# Patient Record
Sex: Female | Born: 1975 | Race: White | Hispanic: No | Marital: Married | Smoking: Never smoker
Health system: Southern US, Community
[De-identification: ages and names within clinical notes are randomized; demographics above are authoritative.]

## PROBLEM LIST (undated history)

## (undated) DIAGNOSIS — N83209 Unspecified ovarian cyst, unspecified side: Secondary | ICD-10-CM

## (undated) DIAGNOSIS — H7290 Unspecified perforation of tympanic membrane, unspecified ear: Secondary | ICD-10-CM

## (undated) DIAGNOSIS — H9190 Unspecified hearing loss, unspecified ear: Secondary | ICD-10-CM

## (undated) DIAGNOSIS — K219 Gastro-esophageal reflux disease without esophagitis: Secondary | ICD-10-CM

## (undated) DIAGNOSIS — J45909 Unspecified asthma, uncomplicated: Secondary | ICD-10-CM

## (undated) HISTORY — PX: NASAL SINUS SURGERY: SHX719

---

## 2014-11-06 ENCOUNTER — Ambulatory Visit: Payer: Self-pay | Admitting: Orthopedic Surgery

## 2014-11-22 ENCOUNTER — Ambulatory Visit: Payer: Self-pay | Admitting: Orthopedic Surgery

## 2014-11-22 NOTE — H&P (Signed)
TOTAL HIP ADMISSION H&P  Patient is admitted for left total hip arthroplasty.  Subjective:  Chief Complaint: left hip pain  HPI: Jenny Sampson, 39 y.o. 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  Onset of symptoms was abrupt starting 1 years ago with rapidlly worsening course since that time.The patient noted no past surgery on the left hip(s).  Patient currently rates pain in the left hip at 10 out of 10 with activity. Patient has night pain, worsening of pain with activity and weight bearing, pain that interfers with activities of daily living and pain with passive range of motion. 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 This condition presents safety issues increasing the risk of falls.  There is no current active infection.  There are no active problems to display for this patient.  No past medical history on file.  No past surgical history on file.   (Not in a hospital admission) Allergies not on file  History  Substance Use Topics  . Smoking status: Not on file  . Smokeless tobacco: Not on file  . Alcohol Use: Not on file    No family history on file.   ROS  Objective:  Physical Exam  Vital signs in last 24 hours: @  Labs:   There is no height or weight on file to calculate BMI.   Imaging Review Plain radiographs demonstrate moderate degenerative joint disease of the left hip(s). The bone quality appears to be adequate for age and reported activity level. 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  Assessment/Plan:  AVN, left hip(s)  The patient history, physical examination, clinical judgement of the provider and imaging studies are consistent with end stage degenerative  joint disease of the left hip(s) and total hip arthroplasty is deemed medically necessary. The treatment options including medical management, injection therapy, arthroscopy and arthroplasty were discussed at length. The risks and benefits of total hip arthroplasty were presented and reviewed. The risks due to aseptic loosening, infection, stiffness, dislocation/subluxation,  thromboembolic complications and other imponderables were discussed.  The patient acknowledged the explanation, agreed to proceed with the plan and consent was signed. Patient is being admitted for inpatient treatment for surgery, pain control, PT, OT, prophylactic antibiotics, VTE prophylaxis, progressive ambulation and ADL's and discharge planning.The patient is planning to be discharged home with home health services

## 2014-11-22 NOTE — H&P (Signed)
TOTAL HIP ADMISSION H&P  Patient is admitted for left total hip arthroplasty.  Subjective:  Chief Complaint: left hip pain  HPI: Jenny Sampson, 39 y.o. 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  Onset of symptoms was abrupt starting 1 years ago with rapidlly worsening course since that time.The patient noted no past surgery on the left hip(s).  Patient currently rates pain in the left hip at 10 out of 10 with activity. Patient has night pain, worsening of pain with activity and weight bearing, pain that interfers with activities of daily living and pain with passive range of motion. 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 This condition presents safety issues increasing the risk of falls. There is no current active infection.  There are no active problems to display for this patient.  No past medical history on file.  No past surgical history on file.   (Not in a hospital admission) Allergies not on file  History  Substance Use Topics  . Smoking status: Not on file  . Smokeless tobacco: Not on file  . Alcohol Use: Not on file    No family history on file.   Review of Systems  Constitutional: Negative.   HENT: Positive for hearing loss.   Eyes: Negative.   Respiratory: Negative.   Cardiovascular: Negative.   Gastrointestinal: Positive for nausea.  Genitourinary: Negative.   Musculoskeletal: Positive for joint pain.  Skin: Negative.   Neurological: Negative.   Endo/Heme/Allergies: Negative.   Psychiatric/Behavioral: Negative.     Objective:  Physical Exam  Constitutional: She is oriented to person, place, and time. She appears well-developed and well-nourished.  HENT:  Head: Normocephalic and atraumatic.  Eyes:  Conjunctivae and EOM are normal. Pupils are equal, round, and reactive to light.  Neck: Normal range of motion. Neck supple.  Cardiovascular: Normal rate, regular rhythm, normal heart sounds and intact distal pulses.   Respiratory: Effort normal and breath sounds normal. No respiratory distress.  GI: Soft. Bowel sounds are normal. She exhibits no distension. There is no tenderness.  Genitourinary:  deferred  Musculoskeletal:       Left hip: She exhibits decreased range of motion and tenderness.  Neurological: She is alert and oriented to person, place, and time. She has normal reflexes.  Skin: Skin is warm and dry.  Psychiatric: She has a normal mood and affect. Her behavior is normal. Judgment and thought content normal.    Vital signs in last 24 hours: @VSRANGES @  Labs:   There is no height or weight on file to calculate BMI.   Imaging Review Plain radiographs demonstrate AVN of the  left hip with collapse. The bone quality appears to be adequate for age and reported activity level.  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 The treatment options including medical management, injection therapy, arthroscopy and arthroplasty were discussed at length. The risks and benefits of total hip arthroplasty were presented and reviewed. The risks due to aseptic loosening, infection, stiffness, dislocation/subluxation,  thromboembolic complications and other imponderables were discussed.  The patient acknowledged the explanation, agreed to proceed with the plan and consent was  signed. Patient is being admitted for inpatient treatment for surgery, pain control, PT, OT, prophylactic antibiotics, VTE prophylaxis, progressive ambulation and ADL's and discharge planning.The patient is planning to be  discharged home with home health services

## 2014-11-29 NOTE — Progress Notes (Signed)
Abbreviations noted per surgical consent. Need clarification. Thanks.

## 2014-11-30 ENCOUNTER — Encounter (HOSPITAL_COMMUNITY)
Admission: RE | Admit: 2014-11-30 | Discharge: 2014-11-30 | Disposition: A | Discharge status: Home / Self Care | Payer: BLUE CROSS/BLUE SHIELD | Source: Ambulatory Visit | Attending: Orthopedic Surgery | Admitting: Orthopedic Surgery

## 2014-11-30 ENCOUNTER — Encounter (HOSPITAL_COMMUNITY): Payer: Self-pay

## 2014-11-30 DIAGNOSIS — Z01818 Encounter for other preprocedural examination: Secondary | ICD-10-CM | POA: Diagnosis present

## 2014-11-30 HISTORY — DX: Gastro-esophageal reflux disease without esophagitis: K21.9

## 2014-11-30 HISTORY — DX: Unspecified hearing loss, unspecified ear: H91.90

## 2014-11-30 HISTORY — DX: Unspecified perforation of tympanic membrane, unspecified ear: H72.90

## 2014-11-30 HISTORY — DX: Unspecified ovarian cyst, unspecified side: N83.209

## 2014-11-30 HISTORY — DX: Unspecified asthma, uncomplicated: J45.909

## 2014-11-30 LAB — APTT: aPTT: 31 seconds (ref 24–37)

## 2014-11-30 LAB — SURGICAL PCR SCREEN
MRSA, PCR: NEGATIVE
Staphylococcus aureus: NEGATIVE

## 2014-11-30 LAB — ABO/RH: ABO/RH(D): B POS

## 2014-11-30 LAB — PROTIME-INR
INR: 0.93 (ref 0.00–1.49)
Prothrombin Time: 12.7 seconds (ref 11.6–15.2)

## 2014-11-30 LAB — HCG, SERUM, QUALITATIVE: Preg, Serum: NEGATIVE

## 2014-11-30 NOTE — Patient Instructions (Signed)
Jenny Sampson  11/30/2014   Your procedure is scheduled on: Thursday December 07, 2014  Report to [facility] Main  Entrance take [place]  elevators to 3rd floor to  Short Stay Center at 8:15 AM.  Call this number if you have problems the morning of surgery [phone]   Remember: ONLY 1 PERSON MAY GO WITH YOU TO SHORT STAY TO GET  READY MORNING OF YOUR SURGERY.  Do not eat food or drink liquids :After Midnight.     Take these medicines the morning of surgery with A SIP OF WATER: NONE                               You may not have any metal on your body including hair pins and              piercings  Do not wear jewelry, make-up, lotions, powders or perfumes, deodorant             Do not wear nail polish.  Do not shave  48 hours prior to surgery.               Do not bring valuables to the hospital. [facility] IS NOT             RESPONSIBLE   FOR VALUABLES.  Contacts, dentures or bridgework may not be worn into surgery.  Leave suitcase in the car. After surgery it may be brought to your room.                  Please read over the following fact sheets you were given:MRSA INFORMATION SHEET; INCENTIVE SPIROMETER; BLOOD TRANSFUSION INFORMATION SHEET _____________________________________________________________________             [facility] - Preparing for Surgery Before surgery, you can play an important role.  Because skin is not sterile, your skin needs to be as free of germs as possible.  You can reduce the number of germs on your skin by washing with CHG (chlorahexidine gluconate) soap before surgery.  CHG is an antiseptic cleaner which kills germs and bonds with the skin to continue killing germs even after washing. Please DO NOT use if you have an allergy to CHG or antibacterial soaps.  If your skin becomes reddened/irritated stop using the CHG and inform your nurse when you arrive at Short Stay. Do not shave (including legs and underarms) for at least 48 hours  prior to the first CHG shower.  You may shave your face/neck. Please follow these instructions carefully:  1.  Shower with CHG Soap the night before surgery and the  morning of Surgery.  2.  If you choose to wash your hair, wash your hair first as usual with your  normal  shampoo.  3.  After you shampoo, rinse your hair and body thoroughly to remove the  shampoo.                           4.  Use CHG as you would any other liquid soap.  You can apply chg directly  to the skin and wash                       Gently with a scrungie or clean washcloth.  5.  Apply the CHG  Soap to your body ONLY FROM THE NECK DOWN.   Do not use on face/ open                           Wound or open sores. Avoid contact with eyes, ears mouth and genitals (private parts).                       Wash face,  Genitals (private parts) with your normal soap.             6.  Wash thoroughly, paying special attention to the area where your surgery  will be performed.  7.  Thoroughly rinse your body with warm water from the neck down.  8.  DO NOT shower/wash with your normal soap after using and rinsing off  the CHG Soap.                9.  Pat yourself dry with a clean towel.            10.  Wear clean pajamas.            11.  Place clean sheets on your bed the night of your first shower and do not  sleep with pets. Day of Surgery : Do not apply any lotions/deodorants the morning of surgery.  Please wear clean clothes to the hospital/surgery center.  FAILURE TO FOLLOW THESE INSTRUCTIONS MAY RESULT IN THE CANCELLATION OF YOUR SURGERY PATIENT SIGNATURE_________________________________  NURSE SIGNATURE__________________________________  ________________________________________________________________________   Jenny Sampson  An incentive spirometer is a tool that can help keep your lungs clear and active. This tool measures how well you are filling your lungs with each breath. Taking long deep breaths may help reverse  or decrease the chance of developing breathing (pulmonary) problems (especially infection) following:  A long period of time when you are unable to move or be active. BEFORE THE PROCEDURE   If the spirometer includes an indicator to show your best effort, your nurse or respiratory therapist will set it to a desired goal.  If possible, sit up straight or lean slightly forward. Try not to slouch.  Hold the incentive spirometer in an upright position. INSTRUCTIONS FOR USE   Sit on the edge of your bed if possible, or sit up as far as you can in bed or on a chair.  Hold the incentive spirometer in an upright position.  Breathe out normally.  Place the mouthpiece in your mouth and seal your lips tightly around it.  Breathe in slowly and as deeply as possible, raising the piston or the ball toward the top of the column.  Hold your breath for 3-5 seconds or for as long as possible. Allow the piston or ball to fall to the bottom of the column.  Remove the mouthpiece from your mouth and breathe out normally.  Rest for a few seconds and repeat Steps 1 through 7 at least 10 times every 1-2 hours when you are awake. Take your time and take a few normal breaths between deep breaths.  The spirometer may include an indicator to show your best effort. Use the indicator as a goal to work toward during each repetition.  After each set of 10 deep breaths, practice coughing to be sure your lungs are clear. If you have an incision (the cut made at the time of surgery), support your incision when coughing by placing a pillow or rolled up towels  firmly against it. Once you are able to get out of bed, walk around indoors and cough well. You may stop using the incentive spirometer when instructed by your caregiver.  RISKS AND COMPLICATIONS  Take your time so you do not get dizzy or light-headed.  If you are in pain, you may need to take or ask for pain medication before doing incentive spirometry. It is  harder to take a deep breath if you are having pain. AFTER USE  Rest and breathe slowly and easily.  It can be helpful to keep track of a log of your progress. Your caregiver can provide you with a simple table to help with this. If you are using the spirometer at home, follow these instructions: [place] IF:   You are having difficultly using the spirometer.  You have trouble using the spirometer as often as instructed.  Your pain medication is not giving enough relief while using the spirometer.  You develop fever of 100.5 F (38.1 C) or higher. SEEK IMMEDIATE MEDICAL CARE IF:   You cough up bloody sputum that had not been present before.  You develop fever of 102 F (38.9 C) or greater.  You develop worsening pain at or near the incision site. MAKE SURE YOU:   Understand these instructions.  Will watch your condition.  Will get help right away if you are not doing well or get worse. Document Released: 08/25/2006 Document Revised: 07/07/2011 Document Reviewed: 10/26/2006 Exit[employer] Patient Information 2014 [employer], [place].   ________________________________________________________________________  WHAT IS A BLOOD TRANSFUSION? Blood Transfusion Information  A transfusion is the replacement of blood or some of its parts. Blood is made up of multiple cells which provide different functions.  Red blood cells carry oxygen and are used for blood loss replacement.  White blood cells fight against infection.  Platelets control bleeding.  Plasma helps clot blood.  Other blood products are available for specialized needs, such as hemophilia or other clotting disorders. BEFORE THE TRANSFUSION  Who gives blood for transfusions?   Healthy volunteers who are fully evaluated to make sure their blood is safe. This is blood bank blood. Transfusion therapy is the safest it has ever been in the practice of medicine. Before blood is taken from a donor, a complete history  is taken to make sure that person has no history of diseases nor engages in risky social behavior (examples are intravenous drug use or sexual activity with multiple partners). The donor's travel history is screened to minimize risk of transmitting infections, such as malaria. The donated blood is tested for signs of infectious diseases, such as HIV and hepatitis. The blood is then tested to be sure it is compatible with you in order to minimize the chance of a transfusion reaction. If you or a relative donates blood, this is often done in anticipation of surgery and is not appropriate for emergency situations. It takes many days to process the donated blood. RISKS AND COMPLICATIONS Although transfusion therapy is very safe and saves many lives, the main dangers of transfusion include:   Getting an infectious disease.  Developing a transfusion reaction. This is an allergic reaction to something in the blood you were given. Every precaution is taken to prevent this. The decision to have a blood transfusion has been considered carefully by your caregiver before blood is given. Blood is not given unless the benefits outweigh the risks. AFTER THE TRANSFUSION  Right after receiving a blood transfusion, you will usually feel much better  and more energetic. This is especially true if your red blood cells have gotten low (anemic). The transfusion raises the level of the red blood cells which carry oxygen, and this usually causes an energy increase.  The nurse administering the transfusion will monitor you carefully for complications. HOME CARE INSTRUCTIONS  No special instructions are needed after a transfusion. You may find your energy is better. Speak with your caregiver about any limitations on activity for underlying diseases you may have. SEEK MEDICAL CARE IF:   Your condition is not improving after your transfusion.  You develop redness or irritation at the intravenous (IV) site. SEEK IMMEDIATE  MEDICAL CARE IF:  Any of the following symptoms occur over the next 12 hours:  Shaking chills.  You have a temperature by mouth above 102 F (38.9 C), not controlled by medicine.  Chest, back, or muscle pain.  People around you feel you are not acting correctly or are confused.  Shortness of breath or difficulty breathing.  Dizziness and fainting.  You get a rash or develop hives.  You have a decrease in urine output.  Your urine turns a dark color or changes to pink, red, or brown. Any of the following symptoms occur over the next 10 days:  You have a temperature by mouth above 102 F (38.9 C), not controlled by medicine.  Shortness of breath.  Weakness after normal activity.  The white part of the eye turns yellow (jaundice).  You have a decrease in the amount of urine or are urinating less often.  Your urine turns a dark color or changes to pink, red, or brown. Document Released: 04/11/2000 Document Revised: 07/07/2011 Document Reviewed: 11/29/2007 [facility] Patient Information 2014 [place], [place].  _______________________________________________________________________

## 2014-11-30 NOTE — Progress Notes (Addendum)
H&P per chart per [facility] 11/23/2014 (Dr Leonor Liv) CBCD and CMP results from 11/23/2014 on chart Clearance note per chart per Dr Leonor Liv 11/23/2014  EKG per chart 11/23/2014

## 2014-12-05 ENCOUNTER — Ambulatory Visit: Payer: Self-pay | Admitting: Orthopedic Surgery

## 2014-12-06 LAB — TYPE AND SCREEN
ABO/RH(D): B POS
Antibody Screen: POSITIVE
DAT, IgG: NEGATIVE

## 2014-12-07 ENCOUNTER — Encounter (HOSPITAL_COMMUNITY)
Admission: RE | Disposition: A | Discharge status: Home Health | Payer: Self-pay | Source: Ambulatory Visit | Attending: Orthopedic Surgery

## 2014-12-07 ENCOUNTER — Inpatient Hospital Stay (HOSPITAL_COMMUNITY): Payer: BLUE CROSS/BLUE SHIELD

## 2014-12-07 ENCOUNTER — Inpatient Hospital Stay (HOSPITAL_COMMUNITY): Payer: BLUE CROSS/BLUE SHIELD | Admitting: Anesthesiology

## 2014-12-07 ENCOUNTER — Encounter (HOSPITAL_COMMUNITY): Payer: Self-pay | Admitting: *Deleted

## 2014-12-07 ENCOUNTER — Inpatient Hospital Stay (HOSPITAL_COMMUNITY)
Admission: RE | Admit: 2014-12-07 | Discharge: 2014-12-08 | DRG: 470 | Disposition: A | Discharge status: Home Health | Payer: BLUE CROSS/BLUE SHIELD | Source: Ambulatory Visit | Attending: Orthopedic Surgery | Admitting: Orthopedic Surgery

## 2014-12-07 DIAGNOSIS — J45909 Unspecified asthma, uncomplicated: Secondary | ICD-10-CM | POA: Diagnosis present

## 2014-12-07 DIAGNOSIS — H9190 Unspecified hearing loss, unspecified ear: Secondary | ICD-10-CM | POA: Diagnosis present

## 2014-12-07 DIAGNOSIS — Z09 Encounter for follow-up examination after completed treatment for conditions other than malignant neoplasm: Secondary | ICD-10-CM

## 2014-12-07 DIAGNOSIS — M25552 Pain in left hip: Secondary | ICD-10-CM | POA: Diagnosis present

## 2014-12-07 DIAGNOSIS — M87059 Idiopathic aseptic necrosis of unspecified femur: Secondary | ICD-10-CM | POA: Diagnosis present

## 2014-12-07 DIAGNOSIS — M879 Osteonecrosis, unspecified: Principal | ICD-10-CM | POA: Diagnosis present

## 2014-12-07 DIAGNOSIS — M8700 Idiopathic aseptic necrosis of unspecified bone: Secondary | ICD-10-CM

## 2014-12-07 DIAGNOSIS — M25752 Osteophyte, left hip: Secondary | ICD-10-CM | POA: Diagnosis present

## 2014-12-07 DIAGNOSIS — Z01812 Encounter for preprocedural laboratory examination: Secondary | ICD-10-CM

## 2014-12-07 DIAGNOSIS — K219 Gastro-esophageal reflux disease without esophagitis: Secondary | ICD-10-CM | POA: Diagnosis present

## 2014-12-07 DIAGNOSIS — M87052 Idiopathic aseptic necrosis of left femur: Secondary | ICD-10-CM | POA: Diagnosis present

## 2014-12-07 HISTORY — PX: TOTAL HIP ARTHROPLASTY: SHX124

## 2014-12-07 SURGERY — ARTHROPLASTY, HIP, TOTAL, ANTERIOR APPROACH
Anesthesia: General | Site: Hip | Laterality: Left

## 2014-12-07 MED ORDER — SODIUM CHLORIDE 0.9 % IJ SOLN
INTRAMUSCULAR | Status: DC | PRN
  Administered 2014-12-07: 30 mL

## 2014-12-07 MED ORDER — MIDAZOLAM HCL 2 MG/2ML IJ SOLN
INTRAMUSCULAR | Status: AC
  Filled 2014-12-07: qty 4

## 2014-12-07 MED ORDER — GLYCOPYRROLATE 0.2 MG/ML IJ SOLN
INTRAMUSCULAR | Status: AC
  Filled 2014-12-07: qty 3

## 2014-12-07 MED ORDER — FENTANYL CITRATE (PF) 100 MCG/2ML IJ SOLN
INTRAMUSCULAR | Status: AC
  Filled 2014-12-07: qty 4

## 2014-12-07 MED ORDER — ASPIRIN EC 325 MG PO TBEC
325.0000 mg | DELAYED_RELEASE_TABLET | Freq: Two times a day (BID) | ORAL | Status: DC
  Administered 2014-12-08: 325 mg via ORAL
  Filled 2014-12-07 (×5): qty 1

## 2014-12-07 MED ORDER — DEXAMETHASONE SODIUM PHOSPHATE 10 MG/ML IJ SOLN
INTRAMUSCULAR | Status: AC
  Filled 2014-12-07: qty 1

## 2014-12-07 MED ORDER — MENTHOL 3 MG MT LOZG: 1.0000 | LOZENGE | OROMUCOSAL | Status: DC | PRN

## 2014-12-07 MED ORDER — CEFAZOLIN SODIUM-DEXTROSE 2-3 GM-% IV SOLR
2.0000 g | INTRAVENOUS | Status: AC
  Administered 2014-12-07: 2 g via INTRAVENOUS

## 2014-12-07 MED ORDER — ONDANSETRON HCL 4 MG PO TABS: 4.0000 mg | ORAL_TABLET | Freq: Four times a day (QID) | ORAL | Status: DC | PRN

## 2014-12-07 MED ORDER — CHLORHEXIDINE GLUCONATE 4 % EX LIQD: 60.0000 mL | Freq: Once | CUTANEOUS | Status: DC

## 2014-12-07 MED ORDER — PROMETHAZINE HCL 25 MG/ML IJ SOLN: 6.2500 mg | INTRAMUSCULAR | Status: DC | PRN

## 2014-12-07 MED ORDER — PROPOFOL 10 MG/ML IV BOLUS
INTRAVENOUS | Status: AC
  Filled 2014-12-07: qty 20

## 2014-12-07 MED ORDER — GLYCOPYRROLATE 0.2 MG/ML IJ SOLN
INTRAMUSCULAR | Status: DC | PRN
  Administered 2014-12-07: .5 mg via INTRAVENOUS

## 2014-12-07 MED ORDER — FENTANYL CITRATE (PF) 250 MCG/5ML IJ SOLN
INTRAMUSCULAR | Status: DC | PRN
  Administered 2014-12-07 (×2): 50 ug via INTRAVENOUS
  Administered 2014-12-07: 100 ug via INTRAVENOUS
  Administered 2014-12-07 (×2): 50 ug via INTRAVENOUS

## 2014-12-07 MED ORDER — OXYCODONE HCL 5 MG/5ML PO SOLN: 5.0000 mg | Freq: Once | ORAL | Status: DC | PRN

## 2014-12-07 MED ORDER — SODIUM CHLORIDE 0.9 % IJ SOLN
INTRAMUSCULAR | Status: AC
  Filled 2014-12-07: qty 50

## 2014-12-07 MED ORDER — HYDROMORPHONE HCL 1 MG/ML IJ SOLN
INTRAMUSCULAR | Status: AC
  Filled 2014-12-07: qty 1

## 2014-12-07 MED ORDER — ACETAMINOPHEN 650 MG RE SUPP: 650.0000 mg | Freq: Four times a day (QID) | RECTAL | Status: DC | PRN

## 2014-12-07 MED ORDER — LACTATED RINGERS IV SOLN
INTRAVENOUS | Status: DC
  Administered 2014-12-07: 12:00:00 via INTRAVENOUS
  Administered 2014-12-07: 1000 mL via INTRAVENOUS

## 2014-12-07 MED ORDER — ISOPROPYL ALCOHOL 70 % SOLN
Status: DC | PRN
Start: 2014-12-07 — End: 2014-12-07
  Administered 2014-12-07: 1 via TOPICAL

## 2014-12-07 MED ORDER — SODIUM CHLORIDE 0.9 % IR SOLN
Status: DC | PRN
  Administered 2014-12-07: 1000 mL

## 2014-12-07 MED ORDER — BUPIVACAINE-EPINEPHRINE 0.25% -1:200000 IJ SOLN
INTRAMUSCULAR | Status: DC | PRN
  Administered 2014-12-07: 30 mL

## 2014-12-07 MED ORDER — PHENOL 1.4 % MT LIQD
1.0000 | OROMUCOSAL | Status: DC | PRN
  Filled 2014-12-07: qty 177

## 2014-12-07 MED ORDER — DEXAMETHASONE SODIUM PHOSPHATE 10 MG/ML IJ SOLN
10.0000 mg | Freq: Once | INTRAMUSCULAR | Status: AC
  Administered 2014-12-08: 10 mg via INTRAVENOUS
  Filled 2014-12-07: qty 1

## 2014-12-07 MED ORDER — DOCUSATE SODIUM 100 MG PO CAPS
100.0000 mg | ORAL_CAPSULE | Freq: Two times a day (BID) | ORAL | Status: DC
  Administered 2014-12-08: 100 mg via ORAL

## 2014-12-07 MED ORDER — MIDAZOLAM HCL 5 MG/5ML IJ SOLN
INTRAMUSCULAR | Status: DC | PRN
  Administered 2014-12-07: 2 mg via INTRAVENOUS

## 2014-12-07 MED ORDER — ONDANSETRON HCL 4 MG/2ML IJ SOLN
INTRAMUSCULAR | Status: AC
  Filled 2014-12-07: qty 2

## 2014-12-07 MED ORDER — SODIUM CHLORIDE 0.9 % IV SOLN
INTRAVENOUS | Status: DC
  Administered 2014-12-07 (×2): via INTRAVENOUS

## 2014-12-07 MED ORDER — KETOROLAC TROMETHAMINE 15 MG/ML IJ SOLN
15.0000 mg | Freq: Four times a day (QID) | INTRAMUSCULAR | Status: AC
  Administered 2014-12-07 – 2014-12-08 (×3): 15 mg via INTRAVENOUS
  Filled 2014-12-07 (×3): qty 1

## 2014-12-07 MED ORDER — HYDROMORPHONE HCL 1 MG/ML IJ SOLN: 0.5000 mg | INTRAMUSCULAR | Status: DC | PRN

## 2014-12-07 MED ORDER — ACETAMINOPHEN 325 MG PO TABS
650.0000 mg | ORAL_TABLET | Freq: Four times a day (QID) | ORAL | Status: DC | PRN
  Administered 2014-12-07 – 2014-12-08 (×2): 650 mg via ORAL
  Filled 2014-12-07 (×2): qty 2

## 2014-12-07 MED ORDER — SUCCINYLCHOLINE CHLORIDE 200 MG/10ML IV SOSY
PREFILLED_SYRINGE | INTRAVENOUS | Status: DC | PRN
  Administered 2014-12-07: 80 mg via INTRAVENOUS

## 2014-12-07 MED ORDER — TRANEXAMIC ACID 1000 MG/10ML IV SOLN
1000.0000 mg | INTRAVENOUS | Status: AC
  Administered 2014-12-07: 1000 mg via INTRAVENOUS
  Filled 2014-12-07: qty 10

## 2014-12-07 MED ORDER — OXYCODONE HCL 5 MG PO TABS: 5.0000 mg | ORAL_TABLET | Freq: Once | ORAL | Status: DC | PRN

## 2014-12-07 MED ORDER — ONDANSETRON HCL 4 MG/2ML IJ SOLN
INTRAMUSCULAR | Status: DC | PRN
  Administered 2014-12-07: 4 mg via INTRAVENOUS

## 2014-12-07 MED ORDER — SODIUM CHLORIDE 0.9 % IR SOLN
Status: DC | PRN
  Administered 2014-12-07: 3000 mL

## 2014-12-07 MED ORDER — METOCLOPRAMIDE HCL 10 MG PO TABS: 5.0000 mg | ORAL_TABLET | Freq: Three times a day (TID) | ORAL | Status: DC | PRN

## 2014-12-07 MED ORDER — ROCURONIUM BROMIDE 100 MG/10ML IV SOLN
INTRAVENOUS | Status: DC | PRN
  Administered 2014-12-07: 10 mg via INTRAVENOUS

## 2014-12-07 MED ORDER — KETOROLAC TROMETHAMINE 30 MG/ML IJ SOLN
INTRAMUSCULAR | Status: DC | PRN
  Administered 2014-12-07: 30 mg via INTRAVENOUS

## 2014-12-07 MED ORDER — PROMETHAZINE HCL 25 MG/ML IJ SOLN: 6.2500 mg | Freq: Three times a day (TID) | INTRAMUSCULAR | Status: DC | PRN

## 2014-12-07 MED ORDER — ISOPROPYL ALCOHOL 70 % SOLN
Status: AC
  Filled 2014-12-07: qty 480

## 2014-12-07 MED ORDER — DEXAMETHASONE SODIUM PHOSPHATE 10 MG/ML IJ SOLN
INTRAMUSCULAR | Status: DC | PRN
  Administered 2014-12-07: 10 mg via INTRAVENOUS

## 2014-12-07 MED ORDER — CEFAZOLIN SODIUM-DEXTROSE 2-3 GM-% IV SOLR
INTRAVENOUS | Status: AC
  Filled 2014-12-07: qty 50

## 2014-12-07 MED ORDER — SODIUM CHLORIDE 0.9 % IV SOLN: INTRAVENOUS | Status: DC

## 2014-12-07 MED ORDER — HYDROCODONE-ACETAMINOPHEN 5-325 MG PO TABS: 1.0000 | ORAL_TABLET | ORAL | Status: DC | PRN

## 2014-12-07 MED ORDER — HYDROGEN PEROXIDE 3 % EX SOLN
CUTANEOUS | Status: DC | PRN
  Administered 2014-12-07: 1

## 2014-12-07 MED ORDER — ONDANSETRON HCL 4 MG/2ML IJ SOLN
4.0000 mg | Freq: Four times a day (QID) | INTRAMUSCULAR | Status: DC | PRN
  Administered 2014-12-07: 4 mg via INTRAVENOUS
  Filled 2014-12-07: qty 2

## 2014-12-07 MED ORDER — PROPOFOL 10 MG/ML IV BOLUS
INTRAVENOUS | Status: DC | PRN
  Administered 2014-12-07: 30 mg via INTRAVENOUS
  Administered 2014-12-07: 120 mg via INTRAVENOUS

## 2014-12-07 MED ORDER — KETOROLAC TROMETHAMINE 30 MG/ML IJ SOLN
INTRAMUSCULAR | Status: AC
  Filled 2014-12-07: qty 1

## 2014-12-07 MED ORDER — SENNA 8.6 MG PO TABS: 2.0000 | ORAL_TABLET | Freq: Every day | ORAL | Status: DC

## 2014-12-07 MED ORDER — BUPIVACAINE-EPINEPHRINE (PF) 0.25% -1:200000 IJ SOLN
INTRAMUSCULAR | Status: AC
  Filled 2014-12-07: qty 30

## 2014-12-07 MED ORDER — CEFAZOLIN SODIUM 1-5 GM-% IV SOLN
1.0000 g | Freq: Four times a day (QID) | INTRAVENOUS | Status: AC
  Administered 2014-12-07 (×2): 1 g via INTRAVENOUS
  Filled 2014-12-07 (×2): qty 50

## 2014-12-07 MED ORDER — NEOSTIGMINE METHYLSULFATE 10 MG/10ML IV SOLN
INTRAVENOUS | Status: DC | PRN
  Administered 2014-12-07: 3 mg via INTRAVENOUS

## 2014-12-07 MED ORDER — HYDROGEN PEROXIDE 3 % EX SOLN
CUTANEOUS | Status: AC
  Filled 2014-12-07: qty 473

## 2014-12-07 MED ORDER — FENTANYL CITRATE (PF) 250 MCG/5ML IJ SOLN
INTRAMUSCULAR | Status: AC
  Filled 2014-12-07: qty 25

## 2014-12-07 MED ORDER — METOCLOPRAMIDE HCL 5 MG/ML IJ SOLN
5.0000 mg | Freq: Three times a day (TID) | INTRAMUSCULAR | Status: DC | PRN
  Administered 2014-12-07 – 2014-12-08 (×2): 10 mg via INTRAVENOUS
  Filled 2014-12-07 (×3): qty 2

## 2014-12-07 MED ORDER — HYDROMORPHONE HCL 1 MG/ML IJ SOLN
0.2500 mg | INTRAMUSCULAR | Status: DC | PRN
  Administered 2014-12-07 (×2): 0.25 mg via INTRAVENOUS

## 2014-12-07 SURGICAL SUPPLY — 44 items
BAG DECANTER FOR FLEXI CONT (MISCELLANEOUS) IMPLANT
BAG ZIPLOCK 12X15 (MISCELLANEOUS) IMPLANT
CAPT HIP TOTAL 2 ×2 IMPLANT
CHLORAPREP W/TINT 26ML (MISCELLANEOUS) ×2 IMPLANT
COVER PERINEAL POST (MISCELLANEOUS) ×2 IMPLANT
DECANTER SPIKE VIAL GLASS SM (MISCELLANEOUS) ×2 IMPLANT
DRAPE C-ARM 42X120 X-RAY (DRAPES) ×2 IMPLANT
DRAPE STERI IOBAN 125X83 (DRAPES) ×2 IMPLANT
DRAPE U-SHAPE 47X51 STRL (DRAPES) ×4 IMPLANT
DRSG AQUACEL AG ADV 3.5X10 (GAUZE/BANDAGES/DRESSINGS) ×2 IMPLANT
ELECT BLADE TIP CTD 4 INCH (ELECTRODE) ×2 IMPLANT
ELECT PENCIL ROCKER SW 15FT (MISCELLANEOUS) ×2 IMPLANT
ELECT REM PT RETURN 15FT ADLT (MISCELLANEOUS) ×2 IMPLANT
FACESHIELD WRAPAROUND (MASK) ×4 IMPLANT
GAUZE SPONGE 4X4 12PLY STRL (GAUZE/BANDAGES/DRESSINGS) ×2 IMPLANT
GLOVE BIO SURGEON STRL SZ8.5 (GLOVE) ×4 IMPLANT
GLOVE BIOGEL PI IND STRL 8.5 (GLOVE) ×1 IMPLANT
GLOVE BIOGEL PI INDICATOR 8.5 (GLOVE) ×1
GOWN SPEC L3 XXLG W/TWL (GOWN DISPOSABLE) ×2 IMPLANT
HANDPIECE INTERPULSE COAX TIP (DISPOSABLE) ×1
HOLDER FOLEY CATH W/STRAP (MISCELLANEOUS) ×2 IMPLANT
HOOD PEEL AWAY FACE SHEILD DIS (HOOD) ×4 IMPLANT
KIT BASIN OR (CUSTOM PROCEDURE TRAY) ×2 IMPLANT
LIQUID BAND (GAUZE/BANDAGES/DRESSINGS) ×2 IMPLANT
NEEDLE SPNL 18GX3.5 QUINCKE PK (NEEDLE) ×2 IMPLANT
PACK TOTAL JOINT (CUSTOM PROCEDURE TRAY) ×2 IMPLANT
PEN SKIN MARKING BROAD (MISCELLANEOUS) ×2 IMPLANT
SAW OSC TIP CART 19.5X105X1.3 (SAW) ×2 IMPLANT
SEALER BIPOLAR AQUA 6.0 (INSTRUMENTS) ×2 IMPLANT
SET HNDPC FAN SPRY TIP SCT (DISPOSABLE) ×1 IMPLANT
SUT ETHIBOND NAB CT1 #1 30IN (SUTURE) ×4 IMPLANT
SUT MNCRL AB 3-0 PS2 18 (SUTURE) ×2 IMPLANT
SUT MON AB 2-0 CT1 36 (SUTURE) ×4 IMPLANT
SUT VIC AB 1 CT1 36 (SUTURE) ×2 IMPLANT
SUT VIC AB 2-0 CT1 27 (SUTURE) ×1
SUT VIC AB 2-0 CT1 TAPERPNT 27 (SUTURE) ×1 IMPLANT
SUT VLOC 180 0 24IN GS25 (SUTURE) ×2 IMPLANT
SYR 50ML LL SCALE MARK (SYRINGE) ×2 IMPLANT
TOWEL OR 17X26 10 PK STRL BLUE (TOWEL DISPOSABLE) ×2 IMPLANT
TOWEL OR NON WOVEN STRL DISP B (DISPOSABLE) ×2 IMPLANT
TRAY FOLEY W/METER SILVER 14FR (SET/KITS/TRAYS/PACK) ×2 IMPLANT
TRAY FOLEY W/METER SILVER 16FR (SET/KITS/TRAYS/PACK) ×2 IMPLANT
WATER STERILE IRR 1500ML POUR (IV SOLUTION) ×2 IMPLANT
YANKAUER SUCT BULB TIP 10FT TU (MISCELLANEOUS) ×2 IMPLANT

## 2014-12-07 NOTE — Anesthesia Preprocedure Evaluation (Addendum)
Anesthesia Evaluation  Patient identified by MRN, date of birth, ID band Patient awake    Reviewed: Allergy & Precautions, H&P , NPO status , Patient's Chart, lab work & pertinent test results  History of Anesthesia Complications Negative for: history of anesthetic complications  Airway Mallampati: II  TM Distance: >3 FB Neck ROM: full    Dental no notable dental hx.    Pulmonary neg pulmonary ROS,  breath sounds clear to auscultation  Pulmonary exam normal       Cardiovascular negative cardio ROS Normal cardiovascular examRhythm:regular Rate:Normal     Neuro/Psych negative neurological ROS     GI/Hepatic negative GI ROS, Neg liver ROS,   Endo/Other  negative endocrine ROS  Renal/GU negative Renal ROS     Musculoskeletal   Abdominal   Peds  Hematology negative hematology ROS (+)   Anesthesia Other Findings   Reproductive/Obstetrics negative OB ROS                             Anesthesia Physical Anesthesia Plan  ASA: II  Anesthesia Plan: General   Post-op Pain Management:    Induction: Intravenous  Airway Management Planned: Oral ETT  Additional Equipment:   Intra-op Plan:   Post-operative Plan:   Informed Consent: I have reviewed the patients History and Physical, chart, labs and discussed the procedure including the risks, benefits and alternatives for the proposed anesthesia with the patient or authorized representative who has indicated his/her understanding and acceptance.     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

## 2014-12-07 NOTE — Op Note (Signed)
OPERATIVE REPORT  SURGEON: Samson Frederic, MD   ASSISTANT: Karlton Lemon, PA-C.  PREOPERATIVE DIAGNOSIS: Avascular necrosis, femoral head, Left hip.   POSTOPERATIVE DIAGNOSIS:  Avascular necrosis, femoral head, Left hip.   PROCEDURE: Left total hip arthroplasty, anterior approach.   IMPLANTS: DePuy Tri Lock stem, size 2, std offset. DePuy Pinnacle Cup, size 48 mm. DePuy Altrx liner, size 48 by 28 mm. DePuy Biolox ceramic head ball, size 28 + 1.5 mm.  ANESTHESIA:  General   ESTIMATED BLOOD LOSS: 250 mL.  ANTIBIOTICS: 2g ancef.  DRAINS: None.  COMPLICATIONS: None.   CONDITION: PACU - hemodynamically stable.Marland Kitchen   BRIEF CLINICAL NOTE: Jenny Sampson is a 39 y.o. female with a long-standing history of Left hip AVN. After failing conservative management, the patient was indicated for total hip arthroplasty. The risks, benefits, and alternatives to the procedure were explained, and the patient elected to proceed.  PROCEDURE IN DETAIL: Surgical site was marked by myself. Once inside the operative room, general anesthesia was induced and a foley catheter was inserted. The patient was then positioned on the Hana table. All bony prominences were well padded. The hip was prepped and draped in the normal sterile surgical fashion. A time-out was called verifying side and site of surgery. The patient received IV antibiotics within 60 minutes of beginning the procedure.  The direct anterior approach to the hip was performed through the Hueter interval. Lateral femoral circumflex vessels were treated with the Auqumantys. The anterior capsule was exposed and an inverted T capsulotomy was made.The femoral neck cut was made to the level of the templated cut. A corkscrew was placed into the head and the head was removed. The femoral head was found to have delaminated cartilage. The head was passed to the back table and was measured.  Acetabular exposure was achieved, and the pulvinar and  labrum were excised. Sequental reaming of the acetabulum was then performed up to a size 47 mm reamer. A 48 mm cup was then opened and impacted into place at approximately 40 degrees of abduction and 20 degrees of anteversion. The final polyethylene liner was impacted into place and acetabular osteophytes were removed.   I then gained femoral exposure taking care to protect the abductors and greater trochanter. This was performed using standard external rotation, extension, and adduction. The capsule was peeled off the inner aspect of the greater trochanter, taking care to preserve the short external rotators. A cookie cutter was used to enter the femoral canal, and then the femoral canal finder was placed. Sequential broaching was performed up to a size 2. Calcar planer was used on the femoral neck remnant. I paced a std offset neck and a trial head ball. The hip was reduced. Leg lengths and offset were checked fluoroscopically. The hip was dislocated and trial components were removed. The final implants were placed, and the hip was reduced.  Fluoroscopy was used to confirm component position and leg lengths. At 90 degrees of external rotation and full extension, the hip was stable to an anterior directed force.  The wound was copiously irrigated with a dilute betadine solution followed by normal saline. Marcaine solution was injected into the periarticular soft tissue. The wound was closed in layers using #1 Vicryl and V-Loc for the fascia, 2-0 Vicryl for the subcutaneous fat, 2-0 Monocryl for the deep dermal layer, 3-0 running Monocryl subcuticular stitch, and Dermabond for the skin. Once the glue was fully dried, an Aquacell Ag dressing was applied. The patient was transported to  the recovery room in stable condition. Sponge, needle, and instrument counts were correct at the end of the case x2. The patient tolerated the procedure well and there were no known complications.  Please note  that a surgical assistant was a medical necessity for this procedure to perform it in a safe and expeditious manner. Assistant was necessary to provide appropriate retraction of vital neurovascular structures, to prevent femoral fracture, and to allow for anatomic placement of the prosthesis.

## 2014-12-07 NOTE — Anesthesia Procedure Notes (Signed)
Procedure Name: Intubation Date/Time: 12/07/2014 10:59 AM Performed by: Early Osmond E Pre-anesthesia Checklist: Patient identified, Emergency Drugs available, Suction available and Patient being monitored Patient Re-evaluated:Patient Re-evaluated prior to inductionOxygen Delivery Method: Circle System Utilized Preoxygenation: Pre-oxygenation with 100% oxygen Intubation Type: IV induction Ventilation: Mask ventilation without difficulty Laryngoscope Size: Miller and 2 Grade View: Grade I Tube type: Oral Tube size: 7.0 mm Number of attempts: 1 Airway Equipment and Method: Stylet Placement Confirmation: ETT inserted through vocal cords under direct vision,  positive ETCO2 and breath sounds checked- equal and bilateral Secured at: 18 cm Tube secured with: Tape Dental Injury: Teeth and Oropharynx as per pre-operative assessment

## 2014-12-07 NOTE — Transfer of Care (Signed)
Immediate Anesthesia Transfer of Care Note  Patient: Jenny Sampson  Procedure(s) Performed: Procedure(s): LEFT TOTAL HIP ARTHROPLASTY ANTERIOR APPROACH (Left)  Patient Location: PACU  Anesthesia Type:General  Level of Consciousness: awake, alert , oriented and patient cooperative  Airway & Oxygen Therapy: Patient Spontanous Breathing and Patient connected to face mask oxygen  Post-op Assessment: Report given to RN and Post -op Vital signs reviewed and stable  Post vital signs: Reviewed and stable  Last Vitals:  Filed Vitals:   12/07/14 0803  BP: 114/67  Pulse: 56  Temp: 36.6 C  Resp: 18    Complications: No apparent anesthesia complications

## 2014-12-07 NOTE — Anesthesia Postprocedure Evaluation (Signed)
  Anesthesia Post-op Note  Patient: Jenny Sampson  Procedure(s) Performed: Procedure(s) (LRB): LEFT TOTAL HIP ARTHROPLASTY ANTERIOR APPROACH (Left)  Patient Location: PACU  Anesthesia Type: General  Level of Consciousness: awake and alert   Airway and Oxygen Therapy: Patient Spontanous Breathing  Post-op Pain: mild  Post-op Assessment: Post-op Vital signs reviewed, Patient's Cardiovascular Status Stable, Respiratory Function Stable, Patent Airway and No signs of Nausea or vomiting  Last Vitals:  Filed Vitals:   12/07/14 1345  BP: 115/53  Pulse: 67  Temp:   Resp: 11    Post-op Vital Signs: stable   Complications: No apparent anesthesia complications

## 2014-12-07 NOTE — Discharge Instructions (Signed)
°Dr. Marlet Korte °Joint Replacement Specialist °[facility] °[address] °,  [phone] °[phone] ° ° °TOTAL HIP REPLACEMENT POSTOPERATIVE DIRECTIONS ° ° ° °Hip Rehabilitation, Guidelines Following Surgery  ° °WEIGHT BEARING °Weight bearing as tolerated with assist device (walker, cane, etc) as directed, use it as long as suggested by your surgeon or therapist, typically at least 4-6 weeks. ° °The results of a hip operation are greatly improved after range of motion and muscle strengthening exercises. Follow all safety measures which are given to protect your hip. If any of these exercises cause increased pain or swelling in your joint, decrease the amount until you are comfortable again. Then slowly increase the exercises. Call your caregiver if you have problems or questions.  ° °HOME CARE INSTRUCTIONS  °Most of the following instructions are designed to prevent the dislocation of your new hip.  °Remove items at home which could result in a fall. This includes throw rugs or furniture in walking pathways.  °Continue medications as instructed at time of discharge. °· You may have some home medications which will be placed on hold until you complete the course of blood thinner medication. °· You may start showering once you are discharged home. Do not remove your dressing. °Do not put on socks or shoes without following the instructions of your caregivers.   °Sit on chairs with arms. Use the chair arms to help push yourself up when arising.  °Arrange for the use of a toilet seat elevator so you are not sitting low.  °· Walk with walker as instructed.  °You may resume a sexual relationship in one month or when given the OK by your caregiver.  °Use walker as long as suggested by your caregivers.  °You may put full weight on your legs and walk as much as is comfortable. °Avoid periods of inactivity such as sitting longer than an hour when not asleep. This helps prevent  blood clots.  °You may return to work once you are cleared by your surgeon.  °Do not drive a car for 6 weeks or until released by your surgeon.  °Do not drive while taking narcotics.  °Wear elastic stockings for two weeks following surgery during the day but you may remove then at night.  °Make sure you keep all of your appointments after your operation with all of your doctors and caregivers. You should call the office at the above phone number and make an appointment for approximately two weeks after the date of your surgery. °Please pick up a stool softener and laxative for home use as long as you are requiring pain medications. °· ICE to the affected hip every three hours for 30 minutes at a time and then as needed for pain and swelling. Continue to use ice on the hip for pain and swelling from surgery. You may notice swelling that will progress down to the foot and ankle.  This is normal after surgery.  Elevate the leg when you are not up walking on it.   °It is important for you to complete the blood thinner medication as prescribed by your doctor. °· Continue to use the breathing machine which will help keep your temperature down.  It is common for your temperature to cycle up and down following surgery, especially at night when you are not up moving around and exerting yourself.  The breathing machine keeps your lungs expanded and your temperature down. ° °RANGE OF MOTION AND STRENGTHENING EXERCISES  °These exercises are   designed to help you keep full movement of your hip joint. Follow your caregiver's or physical therapist's instructions. Perform all exercises about fifteen times, three times per day or as directed. Exercise both hips, even if you have had only one joint replacement. These exercises can be done on a training (exercise) mat, on the floor, on a table or on a bed. Use whatever works the best and is most comfortable for you. Use music or television while you are exercising so that the exercises  are a pleasant break in your day. This will make your life better with the exercises acting as a break in routine you can look forward to.  °Lying on your back, slowly slide your foot toward your buttocks, raising your knee up off the floor. Then slowly slide your foot back down until your leg is straight again.  °Lying on your back spread your legs as far apart as you can without causing discomfort.  °Lying on your side, raise your upper leg and foot straight up from the floor as far as is comfortable. Slowly lower the leg and repeat.  °Lying on your back, tighten up the muscle in the front of your thigh (quadriceps muscles). You can do this by keeping your leg straight and trying to raise your heel off the floor. This helps strengthen the largest muscle supporting your knee.  °Lying on your back, tighten up the muscles of your buttocks both with the legs straight and with the knee bent at a comfortable angle while keeping your heel on the floor.  ° °SKILLED REHAB INSTRUCTIONS: °If the patient is transferred to a skilled rehab facility following release from the hospital, a list of the current medications will be sent to the facility for the patient to continue.  When discharged from the skilled rehab facility, please have the facility set up the patient's Home Health Physical Therapy prior to being released. Also, the skilled facility will be responsible for providing the patient with their medications at time of release from the facility to include their pain medication and their blood thinner medication. If the patient is still at the rehab facility at time of the two week follow up appointment, the skilled rehab facility will also need to assist the patient in arranging follow up appointment in our office and any transportation needs. ° °MAKE SURE YOU:  °Understand these instructions.  °Will watch your condition.  °Will get help right away if you are not doing well or get worse. ° °Pick up stool softner and  laxative for home use following surgery while on pain medications. °Do not remove your dressing. °The dressing is waterproof--it is OK to take showers. °Continue to use ice for pain and swelling after surgery. °Do not use any lotions or creams on the incision until instructed by your surgeon. °Total Hip Protocol. ° ° °

## 2014-12-07 NOTE — Plan of Care (Signed)
Problem: Consults Goal: Diagnosis- Total Joint Replacement Primary Total Hip     

## 2014-12-07 NOTE — H&P (View-Only) (Signed)
TOTAL HIP ADMISSION H&P  Patient is admitted for left total hip arthroplasty.  Subjective:  Chief Complaint: left hip pain  HPI: Jenny Sampson, 39 y.o. 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  Onset of symptoms was abrupt starting 1 years ago with rapidlly worsening course since that time.The patient noted no past surgery on the left hip(s).  Patient currently rates pain in the left hip at 10 out of 10 with activity. Patient has night pain, worsening of pain with activity and weight bearing, pain that interfers with activities of daily living and pain with passive range of motion. 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 This condition presents safety issues increasing the risk of falls. There is no current active infection.  There are no active problems to display for this patient.  No past medical history on file.  No past surgical history on file.   (Not in a hospital admission) Allergies not on file  History  Substance Use Topics  . Smoking status: Not on file  . Smokeless tobacco: Not on file  . Alcohol Use: Not on file    No family history on file.   Review of Systems  Constitutional: Negative.   HENT: Positive for hearing loss.   Eyes: Negative.   Respiratory: Negative.   Cardiovascular: Negative.   Gastrointestinal: Positive for nausea.  Genitourinary: Negative.   Musculoskeletal: Positive for joint pain.  Skin: Negative.   Neurological: Negative.   Endo/Heme/Allergies: Negative.   Psychiatric/Behavioral: Negative.     Objective:  Physical Exam  Constitutional: She is oriented to person, place, and time. She appears well-developed and well-nourished.  HENT:  Head: Normocephalic and atraumatic.  Eyes:  Conjunctivae and EOM are normal. Pupils are equal, round, and reactive to light.  Neck: Normal range of motion. Neck supple.  Cardiovascular: Normal rate, regular rhythm, normal heart sounds and intact distal pulses.   Respiratory: Effort normal and breath sounds normal. No respiratory distress.  GI: Soft. Bowel sounds are normal. She exhibits no distension. There is no tenderness.  Genitourinary:  deferred  Musculoskeletal:       Left hip: She exhibits decreased range of motion and tenderness.  Neurological: She is alert and oriented to person, place, and time. She has normal reflexes.  Skin: Skin is warm and dry.  Psychiatric: She has a normal mood and affect. Her behavior is normal. Judgment and thought content normal.    Vital signs in last 24 hours: @VSRANGES @  Labs:   There is no height or weight on file to calculate BMI.   Imaging Review Plain radiographs demonstrate AVN of the  left hip with collapse. The bone quality appears to be adequate for age and reported activity level.  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 The treatment options including medical management, injection therapy, arthroscopy and arthroplasty were discussed at length. The risks and benefits of total hip arthroplasty were presented and reviewed. The risks due to aseptic loosening, infection, stiffness, dislocation/subluxation,  thromboembolic complications and other imponderables were discussed.  The patient acknowledged the explanation, agreed to proceed with the plan and consent was  signed. Patient is being admitted for inpatient treatment for surgery, pain control, PT, OT, prophylactic antibiotics, VTE prophylaxis, progressive ambulation and ADL's and discharge planning.The patient is planning to be  discharged home with home health services

## 2014-12-07 NOTE — Discharge Summary (Signed)
Physician Discharge Summary  Patient ID: Jenny Sampson MRN: [id number] DOB/AGE: August 17, 1975 39 y.o.  Admit date: 12/07/2014 Discharge date: 12/08/2014  Admission Diagnoses:  Avascular necrosis of bone of left hip  Discharge Diagnoses:  Principal Problem:   Avascular necrosis of bone of left hip Active Problems:   Avascular necrosis of hip   Past Medical History  Diagnosis Date  . Asthma     seasonal pt states rarely has use inhaler   . Perforated eardrum     bilat   . Hearing loss     bilat   . Ovarian cyst   . GERD (gastroesophageal reflux disease)     Surgeries: Procedure(s): LEFT TOTAL HIP ARTHROPLASTY ANTERIOR APPROACH on 12/07/2014   Consultants (if any):    Discharged Condition: Improved  Hospital Course: Jenny Sampson is an 39 y.o. female who was admitted 12/07/2014 with a diagnosis of Avascular necrosis of bone of left hip and went to the operating room on 12/07/2014 and underwent the above named procedures.    She was given perioperative antibiotics:      Anti-infectives    Start     Dose/Rate Route Frequency Ordered Stop   12/07/14 1700  ceFAZolin (ANCEF) IVPB 1 g/50 mL premix     1 g 100 mL/hr over 30 Minutes Intravenous Every 6 hours 12/07/14 1508 12/07/14 2316   12/07/14 0808  ceFAZolin (ANCEF) IVPB 2 g/50 mL premix     2 g 100 mL/hr over 30 Minutes Intravenous On call to O.R. 12/07/14 4098 12/07/14 1121    .  She was given sequential compression devices, early ambulation, and ASA for DVT prophylaxis.  She benefited maximally from the hospital stay and there were no complications.    Recent vital signs:  Filed Vitals:   12/08/14 0503  BP: 102/66  Pulse: 60  Temp: 98 F (36.7 C)  Resp: 14    Recent laboratory studies:  Lab Results  Component Value Date   HGB 10.0* 12/08/2014   Lab Results  Component Value Date   WBC 12.4* 12/08/2014   PLT 231 12/08/2014   Lab Results  Component Value Date   INR 0.93 11/30/2014   Lab Results   Component Value Date   NA 139 12/08/2014   K 4.4 12/08/2014   CL 109 12/08/2014   CO2 24 12/08/2014   BUN 10 12/08/2014   CREATININE 0.60 12/08/2014   GLUCOSE 136* 12/08/2014    Discharge Medications:     Medication List    STOP taking these medications        naproxen sodium 220 MG tablet  Commonly known as:  ANAPROX     omeprazole 20 MG tablet  Commonly known as:  PRILOSEC OTC      TAKE these medications        aspirin EC 325 MG tablet  Take 1 tablet (325 mg total) by mouth 2 (two) times daily after a meal.     docusate sodium 100 MG capsule  Commonly known as:  COLACE  Take 1 capsule (100 mg total) by mouth 2 (two) times daily.     HYDROcodone-acetaminophen 5-325 MG per tablet  Commonly known as:  NORCO  Take 1-2 tablets by mouth every 4 (four) hours as needed for moderate pain.     meloxicam 15 MG tablet  Commonly known as:  MOBIC  Take 1 tablet (15 mg total) by mouth daily.     ondansetron 4 MG tablet  Commonly known as:  ZOFRAN  Take 1 tablet (4 mg total) by mouth every 6 (six) hours as needed for nausea.     senna 8.6 MG Tabs tablet  Commonly known as:  SENOKOT  Take 2 tablets (17.2 mg total) by mouth at bedtime.        Diagnostic Studies: Dg Pelvis Portable  12/07/2014   CLINICAL DATA:  39 year old female status post left hip arthroplasty.  EXAM: PORTABLE PELVIS 1-2 VIEWS  COMPARISON:  12/07/2014.  FINDINGS: Postoperative changes of left hip arthroplasty are noted. Both femoral and acetabular components of the prosthesis appear properly seated without definite periprosthetic fracture or other immediate complicating features. Visualized portions of the bony pelvis and right proximal femur are unremarkable.  IMPRESSION: 1. Status post left hip arthroplasty without immediate complicating features.   Electronically Signed   By: Trudie Reed M.D.   On: 12/07/2014 14:12   Dg C-arm 61-120 Min-no Report  12/07/2014   CLINICAL DATA: SURGERY   C-ARM 61-120  MINUTES  Fluoroscopy was utilized by the requesting physician.  No radiographic  interpretation.    Dg Hip Operative Unilat With Pelvis Left  12/07/2014   CLINICAL DATA:  History of avascular necrosis of the left femoral head.  EXAM: OPERATIVE LEFT HIP  TECHNIQUE: Fluoroscopic spot image(s) were submitted for interpretation post-operatively.  COMPARISON:  MRI 10/24/2014 and pelvis exam 12/07/2014  FINDINGS: Placement of a left hip arthroplasty. The distal aspect of the left femoral stem is visualized without complicating features. Visualized pelvic bony ring is intact.  IMPRESSION: Left hip arthroplasty.   Electronically Signed   By: Richarda Overlie M.D.   On: 12/07/2014 14:53    Disposition: Final discharge disposition not confirmed  Discharge Instructions    Call MD / Call 911    Complete by:  As directed   If you experience chest pain or shortness of breath, CALL 911 and be transported to the hospital emergency room.  If you develope a fever above 101 F, pus (white drainage) or increased drainage or redness at the wound, or calf pain, call your surgeon's office.     Constipation Prevention    Complete by:  As directed   Drink plenty of fluids.  Prune juice may be helpful.  You may use a stool softener, such as Colace (over the counter) 100 mg twice a day.  Use MiraLax (over the counter) for constipation as needed.     Diet - low sodium heart healthy    Complete by:  As directed      Driving restrictions    Complete by:  As directed   No driving for 6 weeks     Increase activity slowly as tolerated    Complete by:  As directed      Lifting restrictions    Complete by:  As directed   No lifting for 6 weeks     TED hose    Complete by:  As directed   Use stockings (TED hose) for 2 weeks on both leg(s).  You may remove them at night for sleeping.           Follow-up Information    Follow up with Shaley Leavens, Cloyde Reams, MD. Schedule an appointment as soon as possible for a visit in 2 weeks.    Specialty:  Orthopedic Surgery   Why:  For wound re-check   Contact information:   [address] [place] [address] [phone]        Signed: Garnet Koyanagi 12/08/2014, 10:12  AM

## 2014-12-07 NOTE — Interval H&P Note (Signed)
History and Physical Interval Note:  12/07/2014 10:49 AM  Jenny Sampson  has presented today for surgery, with the diagnosis of avascular necrosis left hip  The various methods of treatment have been discussed with the patient and family. After consideration of risks, benefits and other options for treatment, the patient has consented to  Procedure(s): LEFT TOTAL HIP ARTHROPLASTY ANTERIOR APPROACH (Left) as a surgical intervention .  The patient's history has been reviewed, patient examined, no change in status, stable for surgery.  I have reviewed the patient's chart and labs.  Questions were answered to the patient's satisfaction.     Ryleigh Esqueda, Cloyde Reams

## 2014-12-08 ENCOUNTER — Encounter (HOSPITAL_COMMUNITY): Payer: Self-pay | Admitting: Orthopedic Surgery

## 2014-12-08 LAB — BASIC METABOLIC PANEL
Anion gap: 6 (ref 5–15)
BUN: 10 mg/dL (ref 6–20)
CHLORIDE: 109 mmol/L (ref 101–111)
CO2: 24 mmol/L (ref 22–32)
Calcium: 7.9 mg/dL — ABNORMAL LOW (ref 8.9–10.3)
Creatinine, Ser: 0.6 mg/dL (ref 0.44–1.00)
GFR calc Af Amer: >60 mL/min (ref >60)
GLUCOSE: 136 mg/dL — AB (ref 65–99)
Potassium: 4.4 mmol/L (ref 3.5–5.1)
SODIUM: 139 mmol/L (ref 135–145)

## 2014-12-08 LAB — CBC
HCT: 30.4 % — ABNORMAL LOW (ref 36.0–46.0)
HEMOGLOBIN: 10 g/dL — AB (ref 12.0–15.0)
MCH: 28.7 pg (ref 26.0–34.0)
MCHC: 32.9 g/dL (ref 30.0–36.0)
MCV: 87.1 fL (ref 78.0–100.0)
Platelets: 231 10*3/uL (ref 150–400)
RBC: 3.49 MIL/uL — ABNORMAL LOW (ref 3.87–5.11)
RDW: 13 % (ref 11.5–15.5)
WBC: 12.4 10*3/uL — ABNORMAL HIGH (ref 4.0–10.5)

## 2014-12-08 MED ORDER — HYDROCODONE-ACETAMINOPHEN 5-325 MG PO TABS: 1.0000 | ORAL_TABLET | ORAL | Status: DC | PRN

## 2014-12-08 MED ORDER — ASPIRIN EC 325 MG PO TBEC: 325.0000 mg | DELAYED_RELEASE_TABLET | Freq: Two times a day (BID) | ORAL | Status: DC

## 2014-12-08 MED ORDER — ONDANSETRON HCL 4 MG PO TABS: 4.0000 mg | ORAL_TABLET | Freq: Four times a day (QID) | ORAL | Status: DC | PRN

## 2014-12-08 MED ORDER — SENNA 8.6 MG PO TABS: 2.0000 | ORAL_TABLET | Freq: Every day | ORAL | Status: AC

## 2014-12-08 MED ORDER — DOCUSATE SODIUM 100 MG PO CAPS: 100.0000 mg | ORAL_CAPSULE | Freq: Two times a day (BID) | ORAL | Status: AC

## 2014-12-08 MED ORDER — MELOXICAM 15 MG PO TABS: 15.0000 mg | ORAL_TABLET | Freq: Every day | ORAL | Status: DC

## 2014-12-08 NOTE — Progress Notes (Signed)
Utilization review completed.  

## 2014-12-08 NOTE — Progress Notes (Signed)
   12/08/14 1400  PT Visit Information  Last PT Received On 12/08/14  Assistance Needed +1  History of Present Illness s/p L DA THA  PT Time Calculation  PT Start Time (ACUTE ONLY) 1404  PT Stop Time (ACUTE ONLY) 1428  PT Time Calculation (min) (ACUTE ONLY) 24 min  Subjective Data  Patient Stated Goal decreased pain, return to active lifestyle  Restrictions  Other Position/Activity Restrictions WBAT  Pain Assessment  Pain Assessment 0-10  Pain Score 4  Pain Location L hip  Pain Descriptors / Indicators Sore;Tightness  Pain Intervention(s) Limited activity within patient's tolerance;Monitored during session;Patient requesting pain meds-RN notified;Ice applied  Cognition  Arousal/Alertness Awake/alert  Behavior During Therapy WFL for tasks assessed/performed  Overall Cognitive Status Within Functional Limits for tasks assessed  Bed Mobility  General bed mobility comments [id number]  Transfers  Overall transfer level Needs assistance  Equipment used Rolling walker (2 wheeled)  Transfers Sit to/from Stand  Sit to Stand Modified independent (Device/Increase time)  General transfer comment pt self corrects for hand placement  Ambulation/Gait  Ambulation/Gait assistance Supervision  Ambulation Distance (Feet) 150 Feet  Assistive device Rolling walker (2 wheeled)  Gait Pattern/deviations Step-to pattern;Antalgic  General Gait Details occasional cues for RW position  General Exercises - Lower Extremity  Ankle Circles/Pumps AROM;Both;5 reps  Quad Sets AROM;Both;15 reps  Heel Slides AAROM;Left;10 reps  Hip ABduction/ADduction AAROM;AROM;Left;10 reps  PT - End of Session  Activity Tolerance Patient tolerated treatment well  Patient left in chair;with call bell/phone within reach;with family/visitor present  Nurse Communication Mobility status  PT - Assessment/Plan  PT Plan Current plan remains appropriate  PT Frequency (ACUTE ONLY) 7X/week  Follow Up Recommendations Home health PT   PT equipment Rolling walker with 5" wheels  PT Goal Progression  Progress towards PT goals Progressing toward goals  Acute Rehab PT Goals  PT Goal Formulation With patient  Time For Goal Achievement 12/10/14  Potential to Achieve Goals Good  PT General Charges  $$ ACUTE PT VISIT 1 Procedure  PT Treatments  $Gait Training 8-22 mins  $Therapeutic Exercise 8-22 mins

## 2014-12-08 NOTE — Progress Notes (Signed)
   12/08/14 1100  OT Visit Information  Last OT Received On 12/08/14  Assistance Needed +1  History of Present Illness s/p L DA THA  OT Time Calculation  OT Start Time (ACUTE ONLY) 0932  OT Stop Time (ACUTE ONLY) 0955  OT Time Calculation (min) 23 min  Precautions  Precautions Fall  Pain Assessment  Pain Score 3  Pain Location L hip  Pain Descriptors / Indicators Sore  Pain Intervention(s) Limited activity within patient's tolerance;Monitored during session;Repositioned;Premedicated before session  Cognition  Arousal/Alertness Awake/alert  Behavior During Therapy WFL for tasks assessed/performed  Overall Cognitive Status Within Functional Limits for tasks assessed  ADL  Toilet Transfer Supervision/safety;Ambulation;Comfort height toilet;Grab bars  Toileting- Clothing Manipulation and Hygiene Supervision/safety;Sit to/from stand (cues for sequence; managing walker in and out of shower)  Tub/ Chemical engineer;Ambulation;3 in 1;Supervision/safety  General ADL Comments pt did not feel she could sit on commode without use of grab bar.  Recommend 3:1 for over the toilet at home.  She has a built in Civil engineer, contracting; return demonstrated shower transfer  Bed Mobility  General bed mobility comments oob  Restrictions  Other Position/Activity Restrictions WBAT  Transfers  Equipment used Rolling walker (2 wheeled)  Transfers Sit to/from Stand  Sit to Stand Modified independent (Device/Increase time)  General transfer comment no cues needed this session  OT - End of Session  Activity Tolerance Patient tolerated treatment well  Patient left in chair;with call bell/phone within reach  OT Assessment/Plan  Follow Up Recommendations No OT follow up;Supervision/Assistance - 24 hour  OT Equipment 3 in 1 bedside comode  OT Goal Progression  Progress towards OT goals Goals met/education completed, patient discharged from [place] Charges  $OT Visit 1 Procedure  OT Treatments   $Self Care/Home Management  8-22 mins  Lesle Chris, OTR/L [phone] 12/08/2014

## 2014-12-08 NOTE — Care Management Note (Signed)
Case Management Note  Patient Details  Name: EMAAN GARY MRN: [id number] Date of Birth: 1975-12-30  Subjective/Objective:         39 yo admitted with Avascular necrosis of bone of left hip           Action/Plan: From home with husband  Expected Discharge Date:                  Expected Discharge Plan:  Home w Home Health Services  In-House Referral:     Discharge planning Services  CM Consult  Post Acute Care Choice:  Home Health Choice offered to:  Patient  DME Arranged:  3-N-1, Walker youth DME Agency:  [facility]  HH Arranged:  PT HH Agency:  [facility]  Status of Service:  Completed, signed off  Medicare Important Message Given:    Date Medicare IM Given:    Medicare IM give by:    Date Additional Medicare IM Given:    Additional Medicare Important Message give by:     If discussed at Long Length of Stay Meetings, dates discussed:    Additional Comments: Pt offered choice for [facility] services and chose [facility]. All information and order faxed to company. DME rep from [facility] given referral for ordered DME. No other DC needs noted. Bartholome Bill, RN 12/08/2014, 11:55 AM

## 2014-12-08 NOTE — Progress Notes (Signed)
   Subjective:  Patient reports pain as mild to moderate.  Denies N/V/CP/SOB.  Objective:   VITALS:   Filed Vitals:   12/07/14 1800 12/07/14 2058 12/08/14 0101 12/08/14 0503  BP: 105/67 95/52 102/61 102/66  Pulse: 52 56 58 60  Temp: 97.6 F (36.4 C) 97.8 F (36.6 C) 97.8 F (36.6 C) 98 F (36.7 C)  TempSrc: Oral Oral Oral Oral  Resp: Height:      Weight:      SpO2: 100% 100% 100% 100%    ABD soft Sensation intact distally Intact pulses distally Dorsiflexion/Plantar flexion intact Incision: dressing C/D/I Compartment soft   Lab Results  Component Value Date   WBC 12.4* 12/08/2014   HGB 10.0* 12/08/2014   HCT 30.4* 12/08/2014   MCV 87.1 12/08/2014   PLT 231 12/08/2014   BMET    Component Value Date/Time   NA 139 12/08/2014 0425   K 4.4 12/08/2014 0425   CL 109 12/08/2014 0425   CO2 24 12/08/2014 0425   GLUCOSE 136* 12/08/2014 0425   BUN 10 12/08/2014 0425   CREATININE 0.60 12/08/2014 0425   CALCIUM 7.9* 12/08/2014 0425   GFRNONAA >60 12/08/2014 0425   GFRAA >60 12/08/2014 0425     Assessment/Plan: 1 Day Post-Op   Principal Problem:   Avascular necrosis of bone of left hip Active Problems:   Avascular necrosis of hip   WBAT with walker ASA, SCDs, TEDs PO pain control Advance diet Up with therapy D/C IV fluids Discharge home with home health    Gale Klar, Cloyde Reams 12/08/2014, 10:09 AM   Samson Frederic, MD Cell [phone]

## 2014-12-08 NOTE — Evaluation (Signed)
Occupational Therapy Evaluation Patient Details Name: Jenny Sampson MRN: [id number] DOB: 08-15-1975 Today's Date: 12/08/2014    History of Present Illness s/p L DA THA   Clinical Impression   This 39 year old female was admitted for the above.  Will return to educate pt on bathroom transfers and further assess for DME.  She will not need post acute follow up OT    Follow Up Recommendations  No OT follow up;Supervision/Assistance - 24 hour    Equipment Recommendations   (to be further assessed)    Recommendations for Other Services       Precautions / Restrictions Precautions Precautions: Fall Restrictions Weight Bearing Restrictions: No      Mobility Bed Mobility Overal bed mobility: Needs Assistance Bed Mobility: Supine to Sit     Supine to sit: Min assist     General bed mobility comments: assist for LLE  Transfers Overall transfer level: Needs assistance Equipment used: Rolling walker (2 wheeled) Transfers: Sit to/from Stand Sit to Stand: Min guard         General transfer comment: for safety:  cues for UE placement    Balance                                            ADL Overall ADL's : Needs assistance/impaired     Grooming: Oral care;Supervision/safety;Standing   Upper Body Bathing: Set up;Sitting   Lower Body Bathing: Minimal assistance;Sit to/from stand   Upper Body Dressing : Set up;Sitting   Lower Body Dressing: Sit to/from stand;With adaptive equipment;Minimal assistance (reacher and sock aide)   Toilet Transfer: Min guard;Ambulation (to recliner (3 feet) from sink)   Toileting- Architect and Hygiene: Min guard;Sit to/from stand         General ADL Comments: This was pt's first time OOB since sx.  Will return to further educate on bathroom transfers.  She is 4'9"--will further assess for 3:1 commode.  Educated on AE and pt used Chief Executive Officer.  Husband is available to assist as needed      Vision     Perception     Praxis      Pertinent Vitals/Pain Pain Assessment: 0-10 Pain Score: 3  Pain Location: L hip Pain Descriptors / Indicators: Sore Pain Intervention(s): Limited activity within patient's tolerance;Monitored during session;Premedicated before session;Repositioned;Ice applied     Hand Dominance     Extremity/Trunk Assessment Upper Extremity Assessment Upper Extremity Assessment: Overall WFL for tasks assessed           Communication Communication Communication: No difficulties (speaks English from the [place])   Cognition Arousal/Alertness: Awake/alert Behavior During Therapy: WFL for tasks assessed/performed Overall Cognitive Status: Within Functional Limits for tasks assessed                     General Comments       Exercises       Shoulder Instructions      Home Living Family/patient expects to be discharged to:: Private residence Living Arrangements: Spouse/significant other Available Help at Discharge: Family;Available 24 hours/day               Bathroom Shower/Tub: Producer, television/film/video: Standard     Home Equipment: Shower seat - built in          Prior Functioning/Environment Level of Independence: Independent  OT Diagnosis: Acute pain   OT Problem List: Decreased strength;Pain;Decreased knowledge of use of DME or AE   OT Treatment/Interventions: Self-care/ADL training;DME and/or AE instruction;Patient/family education    OT Goals(Current goals can be found in the care plan section) Acute Rehab OT Goals Patient Stated Goal: decreased pain OT Goal Formulation: With patient Time For Goal Achievement: 12/10/14 Potential to Achieve Goals: Good ADL Goals Pt Will Transfer to Toilet: with supervision;ambulating;regular height toilet;bedside commode (vs) Pt Will Perform Tub/Shower Transfer: Shower transfer;with min guard assist;ambulating;shower seat  OT Frequency: Min  2X/week   Barriers to D/C:            Co-evaluation              End of Session    Activity Tolerance: Patient tolerated treatment well Patient left: in chair;with call bell/phone within reach   Time: 0932-0955 OT Time Calculation (min): 23 min Charges:  OT General Charges $OT Visit: 1 Procedure OT Evaluation $Initial OT Evaluation Tier I: 1 Procedure G-Codes:    Carolle Ishii 12-21-14, 10:46 AM Marica Otter, OTR/L [phone] 12-21-2014

## 2014-12-08 NOTE — Evaluation (Signed)
Physical Therapy Evaluation Patient Details Name: Jenny Sampson MRN: [id number] DOB: March 09, 1976 Today's Date: 12/08/2014   History of Present Illness  s/p L DA THA  Clinical Impression  Pt admitted with above diagnosis. Pt currently with functional limitations due to the deficits listed below (see PT Problem List). Pt will benefit from skilled PT to increase their independence and safety with mobility to allow discharge to the venue listed below.  See again for ex program     Follow Up Recommendations Home health PT    Equipment Recommendations  Rolling walker with 5" wheels    Recommendations for Other Services       Precautions / Restrictions Precautions Precautions: Fall Restrictions Weight Bearing Restrictions: No Other Position/Activity Restrictions: WBAT      Mobility  Bed Mobility Overal bed mobility: Needs Assistance Bed Mobility: Supine to Sit     Supine to sit: Min assist     General bed mobility comments: NT pt in chair per OT  Transfers Overall transfer level: Needs assistance Equipment used: Rolling walker (2 wheeled) Transfers: Sit to/from Stand Sit to Stand: Supervision         General transfer comment: for safety:  cues for UE placement  Ambulation/Gait Ambulation/Gait assistance: Supervision Ambulation Distance (Feet): 250 Feet Assistive device: Rolling walker (2 wheeled) Gait Pattern/deviations: Step-to pattern;Antalgic;Decreased step length - right;Decreased step length - left        Stairs Stairs: Yes Stairs assistance: Min assist Stair Management: Step to pattern;Backwards;With walker Number of Stairs: 1 (x2) General stair comments: cues for technique; discussed using same technique to enter her very high bed (with stool)  Wheelchair Mobility    Modified Rankin (Stroke Patients Only)       Balance                                             Pertinent Vitals/Pain Pain Assessment: 0-10 Pain Score: 3   Pain Location: L hip Pain Descriptors / Indicators: Sore Pain Intervention(s): Limited activity within patient's tolerance;Monitored during session;Premedicated before session;Repositioned    Home Living Family/patient expects to be discharged to:: Private residence Living Arrangements: Spouse/significant other Available Help at Discharge: Family;Available 24 hours/day           Home Equipment: Shower seat - built in      Prior Function Level of Independence: Independent               Hand Dominance        Extremity/Trunk Assessment   Upper Extremity Assessment: Overall WFL for tasks assessed           Lower Extremity Assessment: LLE deficits/detail   LLE Deficits / Details: hip and knee grossly 2+/5; ankle WFL; AROM hip limited by pain      Communication   Communication: No difficulties (speaks English from the [place])  Cognition Arousal/Alertness: Awake/alert Behavior During Therapy: WFL for tasks assessed/performed Overall Cognitive Status: Within Functional Limits for tasks assessed                      General Comments      Exercises        Assessment/Plan    PT Assessment Patient needs continued PT services  PT Diagnosis Difficulty walking   PT Problem List Decreased range of motion;Decreased activity tolerance;Decreased mobility;Pain  PT Treatment Interventions DME instruction;Gait training;Functional mobility  training;Therapeutic activities;Patient/family education;Stair training;Therapeutic exercise   PT Goals (Current goals can be found in the Care Plan section) Acute Rehab PT Goals Patient Stated Goal: decreased pain, return to active lifestyle PT Goal Formulation: With patient Time For Goal Achievement: 12/10/14 Potential to Achieve Goals: Good    Frequency 7X/week   Barriers to discharge        Co-evaluation               End of Session   Activity Tolerance: Patient tolerated treatment well Patient  left: in chair;with call bell/phone within reach           Time: 1041-1055 PT Time Calculation (min) (ACUTE ONLY): 14 min   Charges:   PT Evaluation $Initial PT Evaluation Tier I: 1 Procedure     PT G CodesDrucilla Chalet 2015/01/05, 10:57 AM

## 2014-12-26 DIAGNOSIS — H1010 Acute atopic conjunctivitis, unspecified eye: Secondary | ICD-10-CM | POA: Insufficient documentation

## 2014-12-26 DIAGNOSIS — J309 Allergic rhinitis, unspecified: Principal | ICD-10-CM

## 2015-02-16 ENCOUNTER — Ambulatory Visit (INDEPENDENT_AMBULATORY_CARE_PROVIDER_SITE_OTHER): Payer: BLUE CROSS/BLUE SHIELD

## 2015-02-16 DIAGNOSIS — J309 Allergic rhinitis, unspecified: Secondary | ICD-10-CM

## 2015-03-12 ENCOUNTER — Ambulatory Visit (INDEPENDENT_AMBULATORY_CARE_PROVIDER_SITE_OTHER): Payer: BLUE CROSS/BLUE SHIELD

## 2015-03-12 DIAGNOSIS — J309 Allergic rhinitis, unspecified: Secondary | ICD-10-CM

## 2015-03-29 ENCOUNTER — Ambulatory Visit (INDEPENDENT_AMBULATORY_CARE_PROVIDER_SITE_OTHER): Payer: BLUE CROSS/BLUE SHIELD

## 2015-03-29 DIAGNOSIS — J309 Allergic rhinitis, unspecified: Secondary | ICD-10-CM

## 2015-04-02 ENCOUNTER — Ambulatory Visit (INDEPENDENT_AMBULATORY_CARE_PROVIDER_SITE_OTHER): Payer: BLUE CROSS/BLUE SHIELD | Admitting: *Deleted

## 2015-04-02 DIAGNOSIS — J309 Allergic rhinitis, unspecified: Secondary | ICD-10-CM | POA: Diagnosis not present

## 2015-04-16 ENCOUNTER — Ambulatory Visit (INDEPENDENT_AMBULATORY_CARE_PROVIDER_SITE_OTHER): Payer: BLUE CROSS/BLUE SHIELD | Admitting: *Deleted

## 2015-04-16 DIAGNOSIS — J309 Allergic rhinitis, unspecified: Secondary | ICD-10-CM | POA: Diagnosis not present

## 2015-05-03 ENCOUNTER — Ambulatory Visit (INDEPENDENT_AMBULATORY_CARE_PROVIDER_SITE_OTHER): Payer: BLUE CROSS/BLUE SHIELD | Admitting: *Deleted

## 2015-05-03 DIAGNOSIS — J309 Allergic rhinitis, unspecified: Secondary | ICD-10-CM | POA: Diagnosis not present

## 2015-05-11 ENCOUNTER — Ambulatory Visit (INDEPENDENT_AMBULATORY_CARE_PROVIDER_SITE_OTHER): Payer: BLUE CROSS/BLUE SHIELD | Admitting: *Deleted

## 2015-05-11 DIAGNOSIS — J309 Allergic rhinitis, unspecified: Secondary | ICD-10-CM

## 2015-05-24 ENCOUNTER — Ambulatory Visit (INDEPENDENT_AMBULATORY_CARE_PROVIDER_SITE_OTHER): Payer: BLUE CROSS/BLUE SHIELD | Admitting: *Deleted

## 2015-05-24 DIAGNOSIS — J309 Allergic rhinitis, unspecified: Secondary | ICD-10-CM

## 2015-05-31 ENCOUNTER — Ambulatory Visit (INDEPENDENT_AMBULATORY_CARE_PROVIDER_SITE_OTHER): Payer: BLUE CROSS/BLUE SHIELD | Admitting: *Deleted

## 2015-05-31 DIAGNOSIS — J309 Allergic rhinitis, unspecified: Secondary | ICD-10-CM

## 2015-06-14 ENCOUNTER — Ambulatory Visit (INDEPENDENT_AMBULATORY_CARE_PROVIDER_SITE_OTHER): Payer: BLUE CROSS/BLUE SHIELD | Admitting: *Deleted

## 2015-06-14 DIAGNOSIS — J309 Allergic rhinitis, unspecified: Secondary | ICD-10-CM | POA: Diagnosis not present

## 2015-06-22 ENCOUNTER — Ambulatory Visit (INDEPENDENT_AMBULATORY_CARE_PROVIDER_SITE_OTHER): Payer: BLUE CROSS/BLUE SHIELD

## 2015-06-22 DIAGNOSIS — J309 Allergic rhinitis, unspecified: Secondary | ICD-10-CM | POA: Diagnosis not present

## 2015-07-19 DIAGNOSIS — J3089 Other allergic rhinitis: Secondary | ICD-10-CM | POA: Diagnosis not present

## 2015-07-20 DIAGNOSIS — J301 Allergic rhinitis due to pollen: Secondary | ICD-10-CM | POA: Diagnosis not present

## 2015-08-16 ENCOUNTER — Ambulatory Visit (INDEPENDENT_AMBULATORY_CARE_PROVIDER_SITE_OTHER): Payer: BLUE CROSS/BLUE SHIELD | Admitting: *Deleted

## 2015-08-16 DIAGNOSIS — J309 Allergic rhinitis, unspecified: Secondary | ICD-10-CM

## 2015-08-27 DIAGNOSIS — J45901 Unspecified asthma with (acute) exacerbation: Secondary | ICD-10-CM | POA: Diagnosis not present

## 2015-08-27 DIAGNOSIS — B9689 Other specified bacterial agents as the cause of diseases classified elsewhere: Secondary | ICD-10-CM | POA: Diagnosis not present

## 2015-08-27 DIAGNOSIS — J019 Acute sinusitis, unspecified: Secondary | ICD-10-CM | POA: Diagnosis not present

## 2015-08-31 ENCOUNTER — Ambulatory Visit (INDEPENDENT_AMBULATORY_CARE_PROVIDER_SITE_OTHER): Payer: BLUE CROSS/BLUE SHIELD | Admitting: *Deleted

## 2015-08-31 DIAGNOSIS — J309 Allergic rhinitis, unspecified: Secondary | ICD-10-CM

## 2015-09-13 ENCOUNTER — Ambulatory Visit (INDEPENDENT_AMBULATORY_CARE_PROVIDER_SITE_OTHER): Payer: BLUE CROSS/BLUE SHIELD | Admitting: *Deleted

## 2015-09-13 DIAGNOSIS — J309 Allergic rhinitis, unspecified: Secondary | ICD-10-CM

## 2015-09-28 ENCOUNTER — Ambulatory Visit (INDEPENDENT_AMBULATORY_CARE_PROVIDER_SITE_OTHER): Payer: BLUE CROSS/BLUE SHIELD

## 2015-09-28 DIAGNOSIS — J309 Allergic rhinitis, unspecified: Secondary | ICD-10-CM

## 2015-10-11 ENCOUNTER — Ambulatory Visit (INDEPENDENT_AMBULATORY_CARE_PROVIDER_SITE_OTHER): Payer: BLUE CROSS/BLUE SHIELD

## 2015-10-11 DIAGNOSIS — J309 Allergic rhinitis, unspecified: Secondary | ICD-10-CM

## 2015-10-26 ENCOUNTER — Ambulatory Visit (INDEPENDENT_AMBULATORY_CARE_PROVIDER_SITE_OTHER): Payer: BLUE CROSS/BLUE SHIELD | Admitting: *Deleted

## 2015-10-26 DIAGNOSIS — J309 Allergic rhinitis, unspecified: Secondary | ICD-10-CM | POA: Diagnosis not present

## 2015-12-03 ENCOUNTER — Ambulatory Visit (INDEPENDENT_AMBULATORY_CARE_PROVIDER_SITE_OTHER): Payer: BLUE CROSS/BLUE SHIELD | Admitting: *Deleted

## 2015-12-03 DIAGNOSIS — J309 Allergic rhinitis, unspecified: Secondary | ICD-10-CM | POA: Diagnosis not present

## 2015-12-10 ENCOUNTER — Ambulatory Visit (INDEPENDENT_AMBULATORY_CARE_PROVIDER_SITE_OTHER): Payer: BLUE CROSS/BLUE SHIELD | Admitting: *Deleted

## 2015-12-10 DIAGNOSIS — J309 Allergic rhinitis, unspecified: Secondary | ICD-10-CM

## 2015-12-27 ENCOUNTER — Ambulatory Visit (INDEPENDENT_AMBULATORY_CARE_PROVIDER_SITE_OTHER): Payer: BLUE CROSS/BLUE SHIELD | Admitting: *Deleted

## 2015-12-27 DIAGNOSIS — J309 Allergic rhinitis, unspecified: Secondary | ICD-10-CM | POA: Diagnosis not present

## 2016-01-10 ENCOUNTER — Ambulatory Visit (INDEPENDENT_AMBULATORY_CARE_PROVIDER_SITE_OTHER): Payer: BLUE CROSS/BLUE SHIELD | Admitting: *Deleted

## 2016-01-10 DIAGNOSIS — J309 Allergic rhinitis, unspecified: Secondary | ICD-10-CM

## 2016-01-14 ENCOUNTER — Ambulatory Visit (INDEPENDENT_AMBULATORY_CARE_PROVIDER_SITE_OTHER): Payer: BLUE CROSS/BLUE SHIELD | Admitting: *Deleted

## 2016-01-14 DIAGNOSIS — J309 Allergic rhinitis, unspecified: Secondary | ICD-10-CM | POA: Diagnosis not present

## 2016-02-05 DIAGNOSIS — J45901 Unspecified asthma with (acute) exacerbation: Secondary | ICD-10-CM | POA: Diagnosis not present

## 2016-02-29 ENCOUNTER — Ambulatory Visit (INDEPENDENT_AMBULATORY_CARE_PROVIDER_SITE_OTHER): Payer: BLUE CROSS/BLUE SHIELD

## 2016-02-29 DIAGNOSIS — J309 Allergic rhinitis, unspecified: Secondary | ICD-10-CM | POA: Diagnosis not present

## 2016-03-05 DIAGNOSIS — J3089 Other allergic rhinitis: Secondary | ICD-10-CM | POA: Diagnosis not present

## 2016-03-06 DIAGNOSIS — J301 Allergic rhinitis due to pollen: Secondary | ICD-10-CM | POA: Diagnosis not present

## 2016-06-10 NOTE — Addendum Note (Signed)
Addended by: Berna BueWHITAKER, Korrin Waterfield L on: 06/10/2016 10:12 AM   Modules accepted: Orders

## 2017-01-02 DIAGNOSIS — M25551 Pain in right hip: Secondary | ICD-10-CM | POA: Diagnosis not present

## 2017-01-02 DIAGNOSIS — Z471 Aftercare following joint replacement surgery: Secondary | ICD-10-CM | POA: Diagnosis not present

## 2017-01-02 DIAGNOSIS — G8929 Other chronic pain: Secondary | ICD-10-CM | POA: Diagnosis not present

## 2017-01-02 DIAGNOSIS — Z96642 Presence of left artificial hip joint: Secondary | ICD-10-CM | POA: Diagnosis not present

## 2017-01-08 ENCOUNTER — Ambulatory Visit: Payer: Self-pay | Admitting: Orthopedic Surgery

## 2017-01-08 NOTE — Progress Notes (Signed)
Need orders in epic for 9-27 surgery please

## 2017-01-12 DIAGNOSIS — Z1389 Encounter for screening for other disorder: Secondary | ICD-10-CM | POA: Diagnosis not present

## 2017-01-12 DIAGNOSIS — Z6826 Body mass index (BMI) 26.0-26.9, adult: Secondary | ICD-10-CM | POA: Diagnosis not present

## 2017-01-12 DIAGNOSIS — Z0000 Encounter for general adult medical examination without abnormal findings: Secondary | ICD-10-CM | POA: Diagnosis not present

## 2017-01-12 DIAGNOSIS — Z23 Encounter for immunization: Secondary | ICD-10-CM | POA: Diagnosis not present

## 2017-01-12 IMAGING — DX DG PORTABLE PELVIS
1 series · 1 of 1 positions shown · non-contrast
Comparison: 12/07/2014.

CLINICAL DATA: 39-year-old female status post left hip
arthroplasty.

EXAM:
PORTABLE PELVIS 1-2 VIEWS

[pelvis ap]
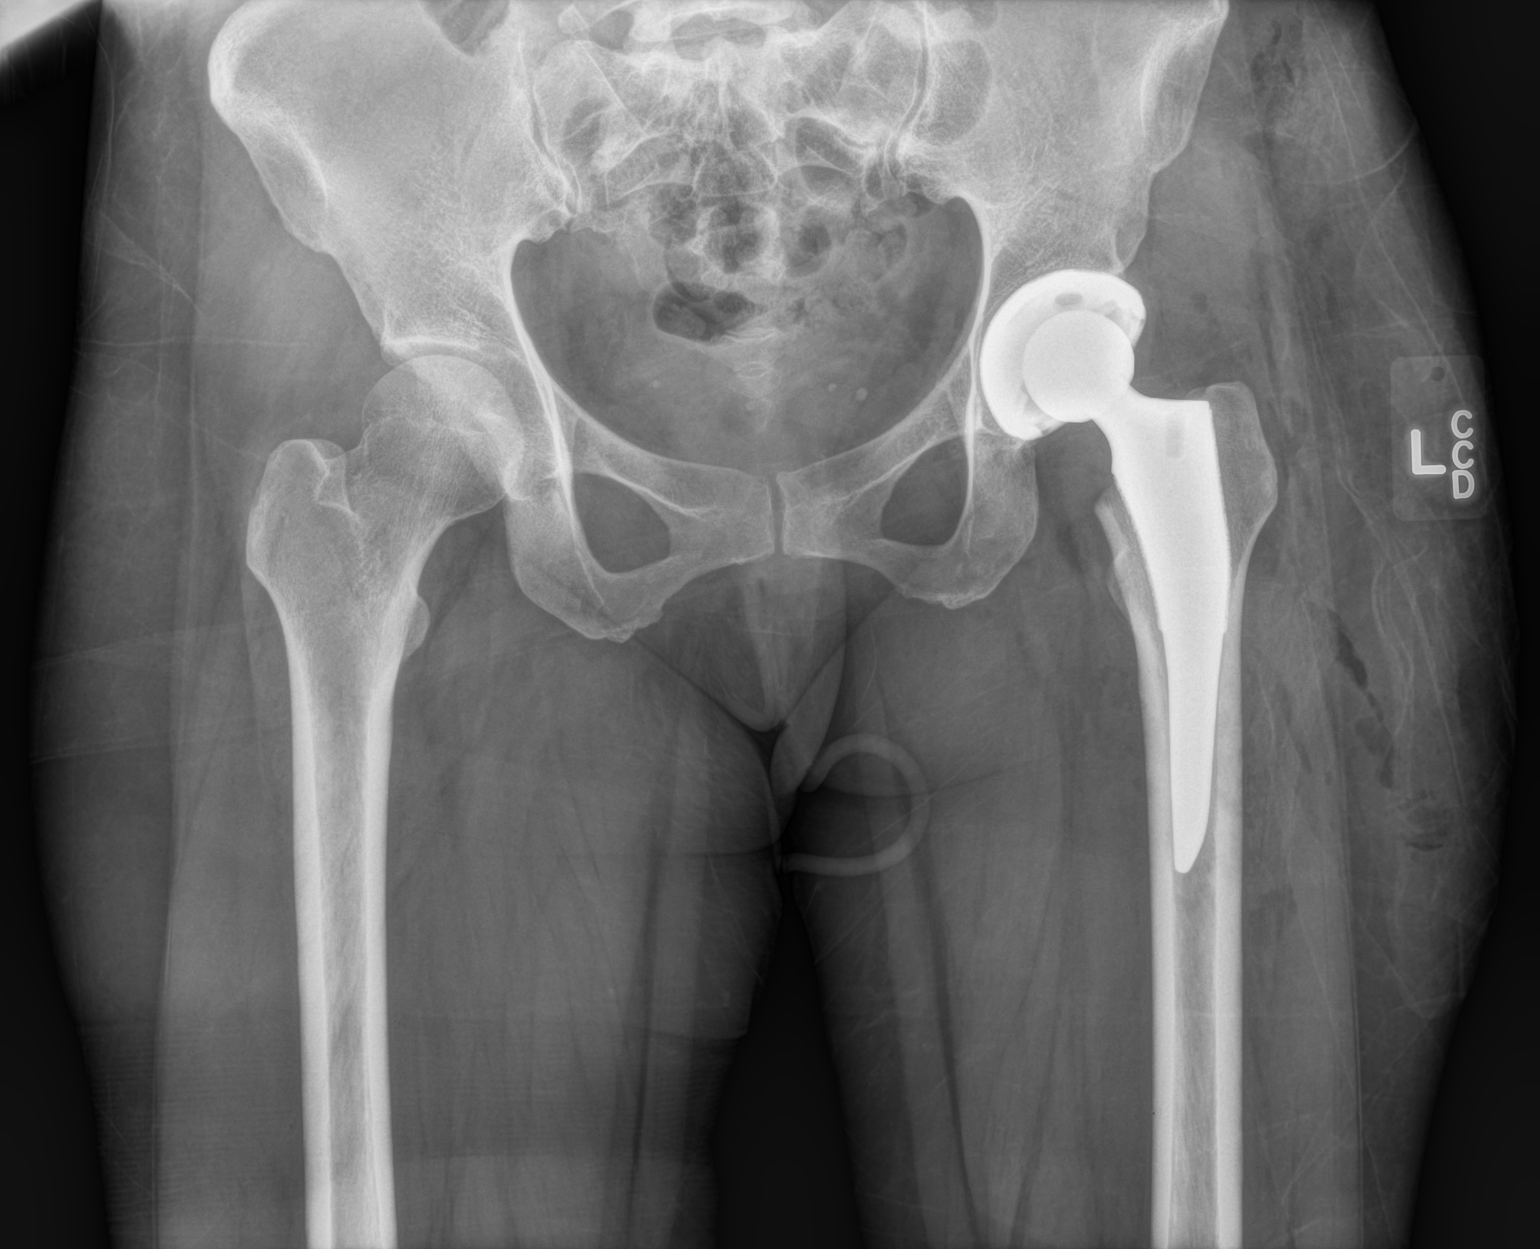

[1 of 1 positions shown; findings below may reference images not displayed]

FINDINGS: Postoperative changes of left hip arthroplasty are noted. Both
femoral and acetabular components of the prosthesis appear properly
seated without definite periprosthetic fracture or other immediate
complicating features. Visualized portions of the bony pelvis and
right proximal femur are unremarkable.
IMPRESSION: 1. Status post left hip arthroplasty without immediate complicating
features.

## 2017-01-12 NOTE — Patient Instructions (Signed)
Jenny Sampson  01/12/2017   Your procedure is scheduled on: 01-22-17   Report to [facility] Main  Entrance Take [address]  Elevators to 3rd floor to  Short Stay Center at 8:30 AM.   Call this number if you have problems the morning of surgery [phone]    Remember: ONLY 1 PERSON MAY GO WITH YOU TO SHORT STAY TO GET  READY MORNING OF YOUR SURGERY.  Do not eat food or drink liquids :After Midnight.     Take these medicines the morning of surgery with A SIP OF WATER: None. You may bring and use your inhaler as needed.                                You may not have any metal on your body including hair pins and              piercings  Do not wear jewelry, make-up, lotions, powders or perfumes, deodorant             Do not wear nail polish.  Do not shave  48 hours prior to surgery.               Do not bring valuables to the hospital. [place] IS NOT             RESPONSIBLE   FOR VALUABLES.  Contacts, dentures or bridgework may not be worn into surgery.  Leave suitcase in the car. After surgery it may be brought to your room.                  Please read over the following fact sheets you were given: _____________________________________________________________________             [facility] - Preparing for Surgery Before surgery, you can play an important role.  Because skin is not sterile, your skin needs to be as free of germs as possible.  You can reduce the number of germs on your skin by washing with CHG (chlorahexidine gluconate) soap before surgery.  CHG is an antiseptic cleaner which kills germs and bonds with the skin to continue killing germs even after washing. Please DO NOT use if you have an allergy to CHG or antibacterial soaps.  If your skin becomes reddened/irritated stop using the CHG and inform your nurse when you arrive at Short Stay. Do not shave (including legs and underarms) for at least 48 hours prior to the first CHG shower.  You may  shave your face/neck. Please follow these instructions carefully:  1.  Shower with CHG Soap the night before surgery and the  morning of Surgery.  2.  If you choose to wash your hair, wash your hair first as usual with your  normal  shampoo.  3.  After you shampoo, rinse your hair and body thoroughly to remove the  shampoo.                           4.  Use CHG as you would any other liquid soap.  You can apply chg directly  to the skin and wash                       Gently with a scrungie or clean washcloth.  5.  Apply the CHG Soap to your body ONLY FROM THE NECK DOWN.   Do not use on face/ open                           Wound or open sores. Avoid contact with eyes, ears mouth and genitals (private parts).                       Wash face,  Genitals (private parts) with your normal soap.             6.  Wash thoroughly, paying special attention to the area where your surgery  will be performed.  7.  Thoroughly rinse your body with warm water from the neck down.  8.  DO NOT shower/wash with your normal soap after using and rinsing off  the CHG Soap.                9.  Pat yourself dry with a clean towel.            10.  Wear clean pajamas.            11.  Place clean sheets on your bed the night of your first shower and do not  sleep with pets. Day of Surgery : Do not apply any lotions/deodorants the morning of surgery.  Please wear clean clothes to the hospital/surgery center.  FAILURE TO FOLLOW THESE INSTRUCTIONS MAY RESULT IN THE CANCELLATION OF YOUR SURGERY PATIENT SIGNATURE_________________________________  NURSE SIGNATURE__________________________________  ________________________________________________________________________   [name]  An incentive spirometer is a tool that can help keep your lungs clear and active. This tool measures how well you are filling your lungs with each breath. Taking long deep breaths may help reverse or decrease the chance of developing  breathing (pulmonary) problems (especially infection) following:  A long period of time when you are unable to move or be active. BEFORE THE PROCEDURE   If the spirometer includes an indicator to show your best effort, your nurse or respiratory therapist will set it to a desired goal.  If possible, sit up straight or lean slightly forward. Try not to slouch.  Hold the incentive spirometer in an upright position. INSTRUCTIONS FOR USE  1. Sit on the edge of your bed if possible, or sit up as far as you can in bed or on a chair. 2. Hold the incentive spirometer in an upright position. 3. Breathe out normally. 4. Place the mouthpiece in your mouth and seal your lips tightly around it. 5. Breathe in slowly and as deeply as possible, raising the piston or the ball toward the top of the column. 6. Hold your breath for 3-5 seconds or for as long as possible. Allow the piston or ball to fall to the bottom of the column. 7. Remove the mouthpiece from your mouth and breathe out normally. 8. Rest for a few seconds and repeat Steps 1 through 7 at least 10 times every 1-2 hours when you are awake. Take your time and take a few normal breaths between deep breaths. 9. The spirometer may include an indicator to show your best effort. Use the indicator as a goal to work toward during each repetition. 10. After each set of 10 deep breaths, practice coughing to be sure your lungs are clear. If you have an incision (the cut made at the time of surgery), support your incision when coughing by placing a pillow or  rolled up towels firmly against it. Once you are able to get out of bed, walk around indoors and cough well. You may stop using the incentive spirometer when instructed by your caregiver.  RISKS AND COMPLICATIONS  Take your time so you do not get dizzy or light-headed.  If you are in pain, you may need to take or ask for pain medication before doing incentive spirometry. It is harder to take a deep  breath if you are having pain. AFTER USE  Rest and breathe slowly and easily.  It can be helpful to keep track of a log of your progress. Your caregiver can provide you with a simple table to help with this. If you are using the spirometer at home, follow these instructions: [place] IF:   You are having difficultly using the spirometer.  You have trouble using the spirometer as often as instructed.  Your pain medication is not giving enough relief while using the spirometer.  You develop fever of 100.5 F (38.1 C) or higher. SEEK IMMEDIATE MEDICAL CARE IF:   You cough up bloody sputum that had not been present before.  You develop fever of 102 F (38.9 C) or greater.  You develop worsening pain at or near the incision site. MAKE SURE YOU:   Understand these instructions.  Will watch your condition.  Will get help right away if you are not doing well or get worse. Document Released: 08/25/2006 Document Revised: 07/07/2011 Document Reviewed: 10/26/2006 ExitCare Patient Information 2014 ExitCare, [place].   ________________________________________________________________________  WHAT IS A BLOOD TRANSFUSION? Blood Transfusion Information  A transfusion is the replacement of blood or some of its parts. Blood is made up of multiple cells which provide different functions.  Red blood cells carry oxygen and are used for blood loss replacement.  White blood cells fight against infection.  Platelets control bleeding.  Plasma helps clot blood.  Other blood products are available for specialized needs, such as hemophilia or other clotting disorders. BEFORE THE TRANSFUSION  Who gives blood for transfusions?   Healthy volunteers who are fully evaluated to make sure their blood is safe. This is blood bank blood. Transfusion therapy is the safest it has ever been in the practice of medicine. Before blood is taken from a donor, a complete history is taken to make sure  that person has no history of diseases nor engages in risky social behavior (examples are intravenous drug use or sexual activity with multiple partners). The donor's travel history is screened to minimize risk of transmitting infections, such as malaria. The donated blood is tested for signs of infectious diseases, such as HIV and hepatitis. The blood is then tested to be sure it is compatible with you in order to minimize the chance of a transfusion reaction. If you or a relative donates blood, this is often done in anticipation of surgery and is not appropriate for emergency situations. It takes many days to process the donated blood. RISKS AND COMPLICATIONS Although transfusion therapy is very safe and saves many lives, the main dangers of transfusion include:   Getting an infectious disease.  Developing a transfusion reaction. This is an allergic reaction to something in the blood you were given. Every precaution is taken to prevent this. The decision to have a blood transfusion has been considered carefully by your caregiver before blood is given. Blood is not given unless the benefits outweigh the risks. AFTER THE TRANSFUSION  Right after receiving a blood transfusion, you will usually  feel much better and more energetic. This is especially true if your red blood cells have gotten low (anemic). The transfusion raises the level of the red blood cells which carry oxygen, and this usually causes an energy increase.  The nurse administering the transfusion will monitor you carefully for complications. HOME CARE INSTRUCTIONS  No special instructions are needed after a transfusion. You may find your energy is better. Speak with your caregiver about any limitations on activity for underlying diseases you may have. SEEK MEDICAL CARE IF:   Your condition is not improving after your transfusion.  You develop redness or irritation at the intravenous (IV) site. SEEK IMMEDIATE MEDICAL CARE IF:  Any of  the following symptoms occur over the next 12 hours:  Shaking chills.  You have a temperature by mouth above 102 F (38.9 C), not controlled by medicine.  Chest, back, or muscle pain.  People around you feel you are not acting correctly or are confused.  Shortness of breath or difficulty breathing.  Dizziness and fainting.  You get a rash or develop hives.  You have a decrease in urine output.  Your urine turns a dark color or changes to pink, red, or brown. Any of the following symptoms occur over the next 10 days:  You have a temperature by mouth above 102 F (38.9 C), not controlled by medicine.  Shortness of breath.  Weakness after normal activity.  The white part of the eye turns yellow (jaundice).  You have a decrease in the amount of urine or are urinating less often.  Your urine turns a dark color or changes to pink, red, or brown. Document Released: 04/11/2000 Document Revised: 07/07/2011 Document Reviewed: 11/29/2007 [facility] Patient Information 2014 [place], [place].  _______________________________________________________________________

## 2017-01-13 ENCOUNTER — Encounter (HOSPITAL_COMMUNITY)
Admission: RE | Admit: 2017-01-13 | Discharge: 2017-01-13 | Disposition: A | Discharge status: Home / Self Care | Payer: BLUE CROSS/BLUE SHIELD | Source: Ambulatory Visit | Attending: Orthopedic Surgery | Admitting: Orthopedic Surgery

## 2017-01-13 ENCOUNTER — Ambulatory Visit: Payer: Self-pay | Admitting: Orthopedic Surgery

## 2017-01-13 ENCOUNTER — Encounter (INDEPENDENT_AMBULATORY_CARE_PROVIDER_SITE_OTHER): Payer: Self-pay

## 2017-01-13 ENCOUNTER — Encounter (HOSPITAL_COMMUNITY): Payer: Self-pay

## 2017-01-13 DIAGNOSIS — Z01812 Encounter for preprocedural laboratory examination: Secondary | ICD-10-CM | POA: Insufficient documentation

## 2017-01-13 DIAGNOSIS — M1611 Unilateral primary osteoarthritis, right hip: Secondary | ICD-10-CM | POA: Insufficient documentation

## 2017-01-13 LAB — CBC
HCT: 36.2 % (ref 36.0–46.0)
Hemoglobin: 12.7 g/dL (ref 12.0–15.0)
MCH: 30.3 pg (ref 26.0–34.0)
MCHC: 35.1 g/dL (ref 30.0–36.0)
MCV: 86.4 fL (ref 78.0–100.0)
Platelets: 268 10*3/uL (ref 150–400)
RBC: 4.19 MIL/uL (ref 3.87–5.11)
RDW: 12.7 % (ref 11.5–15.5)
WBC: 7.1 10*3/uL (ref 4.0–10.5)

## 2017-01-13 LAB — BASIC METABOLIC PANEL
Anion gap: 6 (ref 5–15)
BUN: 11 mg/dL (ref 6–20)
CALCIUM: 8.7 mg/dL — AB (ref 8.9–10.3)
CO2: 26 mmol/L (ref 22–32)
CREATININE: 0.73 mg/dL (ref 0.44–1.00)
Chloride: 106 mmol/L (ref 101–111)
Glucose, Bld: 94 mg/dL (ref 65–99)
Potassium: 4.2 mmol/L (ref 3.5–5.1)
SODIUM: 138 mmol/L (ref 135–145)

## 2017-01-13 LAB — SURGICAL PCR SCREEN
MRSA, PCR: NEGATIVE
STAPHYLOCOCCUS AUREUS: POSITIVE — AB

## 2017-01-13 LAB — PREGNANCY, URINE: PREG TEST UR: NEGATIVE

## 2017-01-13 NOTE — H&P (Signed)
TOTAL HIP ADMISSION H&P  Patient is admitted for right total hip arthroplasty.  Subjective:  Chief Complaint: right hip pain  HPI: Jenny Sampson, 41 y.o. female, has a history of pain and functional disability in the right hip(s) due to AVN and patient has failed non-surgical conservative treatments for greater than 12 weeks to include NSAID's and/or analgesics, flexibility and strengthening excercises, use of assistive devices and activity modification.  Onset of symptoms was gradual starting 2 years ago with rapidlly worsening course since that time.The patient noted no past surgery on the right hip(s).  Patient currently rates pain in the right hip at 10 out of 10 with activity. Patient has night pain, worsening of pain with activity and weight bearing, pain that interfers with activities of daily living, pain with passive range of motion and crepitus. Patient has evidence of AVN by imaging studies. This condition presents safety issues increasing the risk of falls.  There is no current active infection.  Patient Active Problem List   Diagnosis Date Noted  . Allergic rhinoconjunctivitis 12/26/2014  . Avascular necrosis of bone of left hip (HCC) 12/07/2014  . Avascular necrosis of hip (HCC) 12/07/2014   Past Medical History:  Diagnosis Date  . Asthma    seasonal pt states rarely has use inhaler   . GERD (gastroesophageal reflux disease)   . Hearing loss    bilat   . Ovarian cyst   . Perforated eardrum    bilat     Past Surgical History:  Procedure Laterality Date  . NASAL SINUS SURGERY    . TOTAL HIP ARTHROPLASTY Left 12/07/2014   Procedure: LEFT TOTAL HIP ARTHROPLASTY ANTERIOR APPROACH;  Surgeon: Samson Frederic, MD;  Location: [facility];  Service: Orthopedics;  Laterality: Left;     (Not in a hospital admission) Allergies  Allergen Reactions  . Other Itching and Rash    Tree nuts: Peacns    Social History  Substance Use Topics  . Smoking status: Never Smoker  . Smokeless  tobacco: Never Used  . Alcohol use No    No family history on file.   Review of Systems  Constitutional: Negative.   HENT: Positive for hearing loss.   Eyes: Negative.   Respiratory: Negative.   Cardiovascular: Negative.   Gastrointestinal: Negative.   Genitourinary: Negative.   Musculoskeletal: Positive for joint pain.  Skin: Negative.   Neurological: Negative.   Endo/Heme/Allergies: Positive for environmental allergies.  Psychiatric/Behavioral: Negative.     Objective:  Physical Exam  Vitals reviewed. Constitutional: She is oriented to person, place, and time. She appears well-developed and well-nourished.  HENT:  Head: Normocephalic and atraumatic.  Eyes: Pupils are equal, round, and reactive to light. Conjunctivae and EOM are normal.  Neck: Normal range of motion. Neck supple.  Cardiovascular: Normal rate, regular rhythm and intact distal pulses.   Respiratory: Effort normal. No respiratory distress.  GI: Soft. She exhibits no distension.  Genitourinary:  Genitourinary Comments: deferred  Musculoskeletal:       Right hip: She exhibits decreased range of motion, decreased strength and tenderness.  Neurological: She is alert and oriented to person, place, and time. She has normal reflexes.  Skin: Skin is warm and dry.  Psychiatric: She has a normal mood and affect. Her behavior is normal. Judgment and thought content normal.    Vital signs in last 24 hours: @  Labs:   Estimated body mass index is 28.65 kg/m as calculated from the following:   Height as of an earlier  encounter on 01/13/17:  (1.448 m).   Weight as of an earlier encounter on 01/13/17: 60 kg (132 lb 6 oz).   Imaging Review Plain radiographs demonstrate moderate degenerative joint disease of the right hip(s). The bone quality appears to be adequate for age and reported activity level.  Assessment/Plan:  AVN, right hip(s)  The patient history, physical examination, clinical  judgement of the provider and imaging studies are consistent with end stage degenerative joint disease of the right hip(s) and total hip arthroplasty is deemed medically necessary. The treatment options including medical management, injection therapy, arthroscopy and arthroplasty were discussed at length. The risks and benefits of total hip arthroplasty were presented and reviewed. The risks due to aseptic loosening, infection, stiffness, dislocation/subluxation,  thromboembolic complications and other imponderables were discussed.  The patient acknowledged the explanation, agreed to proceed with the plan and consent was signed. Patient is being admitted for inpatient treatment for surgery, pain control, PT, OT, prophylactic antibiotics, VTE prophylaxis, progressive ambulation and ADL's and discharge planning.The patient is planning to be discharged home with HEP

## 2017-01-14 NOTE — Progress Notes (Addendum)
01-13-17 PCR result, routed to Dr. Linna Caprice for review.

## 2017-01-22 ENCOUNTER — Inpatient Hospital Stay (HOSPITAL_COMMUNITY): Payer: BLUE CROSS/BLUE SHIELD

## 2017-01-22 ENCOUNTER — Inpatient Hospital Stay (HOSPITAL_COMMUNITY): Payer: BLUE CROSS/BLUE SHIELD | Admitting: Certified Registered Nurse Anesthetist

## 2017-01-22 ENCOUNTER — Encounter (HOSPITAL_COMMUNITY): Payer: Self-pay | Admitting: *Deleted

## 2017-01-22 ENCOUNTER — Inpatient Hospital Stay (HOSPITAL_COMMUNITY)
Admission: RE | Admit: 2017-01-22 | Discharge: 2017-01-23 | DRG: 470 | Disposition: A | Discharge status: Home / Self Care | Payer: BLUE CROSS/BLUE SHIELD | Source: Ambulatory Visit | Attending: Orthopedic Surgery | Admitting: Orthopedic Surgery

## 2017-01-22 ENCOUNTER — Encounter (HOSPITAL_COMMUNITY)
Admission: RE | Disposition: A | Discharge status: Home / Self Care | Payer: Self-pay | Source: Ambulatory Visit | Attending: Orthopedic Surgery

## 2017-01-22 DIAGNOSIS — Z96642 Presence of left artificial hip joint: Secondary | ICD-10-CM | POA: Diagnosis not present

## 2017-01-22 DIAGNOSIS — J309 Allergic rhinitis, unspecified: Secondary | ICD-10-CM | POA: Diagnosis not present

## 2017-01-22 DIAGNOSIS — Z96641 Presence of right artificial hip joint: Secondary | ICD-10-CM | POA: Diagnosis not present

## 2017-01-22 DIAGNOSIS — M8788 Other osteonecrosis, other site: Principal | ICD-10-CM | POA: Diagnosis present

## 2017-01-22 DIAGNOSIS — M25751 Osteophyte, right hip: Secondary | ICD-10-CM | POA: Diagnosis not present

## 2017-01-22 DIAGNOSIS — J45909 Unspecified asthma, uncomplicated: Secondary | ICD-10-CM | POA: Diagnosis not present

## 2017-01-22 DIAGNOSIS — Z09 Encounter for follow-up examination after completed treatment for conditions other than malignant neoplasm: Secondary | ICD-10-CM

## 2017-01-22 DIAGNOSIS — K219 Gastro-esophageal reflux disease without esophagitis: Secondary | ICD-10-CM | POA: Diagnosis not present

## 2017-01-22 DIAGNOSIS — Z471 Aftercare following joint replacement surgery: Secondary | ICD-10-CM | POA: Diagnosis not present

## 2017-01-22 DIAGNOSIS — Z9181 History of falling: Secondary | ICD-10-CM | POA: Diagnosis not present

## 2017-01-22 DIAGNOSIS — I959 Hypotension, unspecified: Secondary | ICD-10-CM | POA: Diagnosis not present

## 2017-01-22 DIAGNOSIS — H9193 Unspecified hearing loss, bilateral: Secondary | ICD-10-CM | POA: Diagnosis not present

## 2017-01-22 DIAGNOSIS — M87051 Idiopathic aseptic necrosis of right femur: Secondary | ICD-10-CM | POA: Diagnosis not present

## 2017-01-22 DIAGNOSIS — Z91018 Allergy to other foods: Secondary | ICD-10-CM

## 2017-01-22 DIAGNOSIS — M25551 Pain in right hip: Secondary | ICD-10-CM

## 2017-01-22 HISTORY — PX: TOTAL HIP ARTHROPLASTY: SHX124

## 2017-01-22 LAB — TYPE AND SCREEN
ABO/RH(D): B POS
ANTIBODY SCREEN: NEGATIVE

## 2017-01-22 SURGERY — ARTHROPLASTY, HIP, TOTAL, ANTERIOR APPROACH
Anesthesia: General | Site: Hip | Laterality: Right

## 2017-01-22 MED ORDER — ONDANSETRON HCL 4 MG PO TABS: 4.0000 mg | ORAL_TABLET | Freq: Four times a day (QID) | ORAL | Status: DC | PRN

## 2017-01-22 MED ORDER — KETOROLAC TROMETHAMINE 15 MG/ML IJ SOLN
15.0000 mg | Freq: Four times a day (QID) | INTRAMUSCULAR | Status: DC
  Administered 2017-01-22 – 2017-01-23 (×2): 15 mg via INTRAVENOUS
  Filled 2017-01-22 (×2): qty 1

## 2017-01-22 MED ORDER — WATER FOR IRRIGATION, STERILE IR SOLN
Status: DC | PRN
  Administered 2017-01-22: 2000 mL

## 2017-01-22 MED ORDER — GUAIFENESIN ER 600 MG PO TB12: 1200.0000 mg | ORAL_TABLET | Freq: Every evening | ORAL | Status: DC | PRN

## 2017-01-22 MED ORDER — HYDROMORPHONE HCL-NACL 0.5-0.9 MG/ML-% IV SOSY
0.2500 mg | PREFILLED_SYRINGE | INTRAVENOUS | Status: DC | PRN
  Administered 2017-01-22 (×2): 0.5 mg via INTRAVENOUS

## 2017-01-22 MED ORDER — DOCUSATE SODIUM 100 MG PO CAPS
100.0000 mg | ORAL_CAPSULE | Freq: Two times a day (BID) | ORAL | Status: DC
  Filled 2017-01-22: qty 1

## 2017-01-22 MED ORDER — CHLORHEXIDINE GLUCONATE 4 % EX LIQD: 60.0000 mL | Freq: Once | CUTANEOUS | Status: DC

## 2017-01-22 MED ORDER — FENTANYL CITRATE (PF) 100 MCG/2ML IJ SOLN
INTRAMUSCULAR | Status: DC | PRN
  Administered 2017-01-22: 100 ug via INTRAVENOUS
  Administered 2017-01-22: 50 ug via INTRAVENOUS
  Administered 2017-01-22: 100 ug via INTRAVENOUS

## 2017-01-22 MED ORDER — CEFAZOLIN SODIUM-DEXTROSE 2-4 GM/100ML-% IV SOLN
2.0000 g | INTRAVENOUS | Status: AC
  Administered 2017-01-22: 2 g via INTRAVENOUS
  Filled 2017-01-22: qty 100

## 2017-01-22 MED ORDER — DIPHENHYDRAMINE HCL 12.5 MG/5ML PO ELIX
12.5000 mg | ORAL_SOLUTION | ORAL | Status: DC | PRN
  Filled 2017-01-22: qty 10

## 2017-01-22 MED ORDER — METOCLOPRAMIDE HCL 5 MG/ML IJ SOLN
5.0000 mg | Freq: Three times a day (TID) | INTRAMUSCULAR | Status: DC | PRN
  Administered 2017-01-22: 10 mg via INTRAVENOUS
  Filled 2017-01-22: qty 2

## 2017-01-22 MED ORDER — ALBUTEROL SULFATE HFA 108 (90 BASE) MCG/ACT IN AERS
INHALATION_SPRAY | RESPIRATORY_TRACT | Status: DC | PRN
  Administered 2017-01-22: 5 via RESPIRATORY_TRACT

## 2017-01-22 MED ORDER — SODIUM CHLORIDE 0.9 % IV SOLN
INTRAVENOUS | Status: DC
  Administered 2017-01-22 – 2017-01-23 (×2): via INTRAVENOUS

## 2017-01-22 MED ORDER — HYDROMORPHONE HCL-NACL 0.5-0.9 MG/ML-% IV SOSY: 0.5000 mg | PREFILLED_SYRINGE | INTRAVENOUS | Status: DC | PRN

## 2017-01-22 MED ORDER — ROCURONIUM BROMIDE 50 MG/5ML IV SOSY
PREFILLED_SYRINGE | INTRAVENOUS | Status: DC | PRN
  Administered 2017-01-22: 40 mg via INTRAVENOUS

## 2017-01-22 MED ORDER — EPHEDRINE SULFATE 50 MG/ML IJ SOLN
INTRAMUSCULAR | Status: DC | PRN
  Administered 2017-01-22 (×2): 10 mg via INTRAVENOUS

## 2017-01-22 MED ORDER — LIDOCAINE 2% (20 MG/ML) 5 ML SYRINGE
INTRAMUSCULAR | Status: DC | PRN
  Administered 2017-01-22: 50 mg via INTRAVENOUS

## 2017-01-22 MED ORDER — KETOROLAC TROMETHAMINE 30 MG/ML IJ SOLN
INTRAMUSCULAR | Status: AC
  Filled 2017-01-22: qty 1

## 2017-01-22 MED ORDER — MEPERIDINE HCL 50 MG/ML IJ SOLN: 6.2500 mg | INTRAMUSCULAR | Status: DC | PRN

## 2017-01-22 MED ORDER — SODIUM CHLORIDE 0.9 % IR SOLN
Status: DC | PRN
  Administered 2017-01-22: 4000 mL

## 2017-01-22 MED ORDER — SUGAMMADEX SODIUM 200 MG/2ML IV SOLN
INTRAVENOUS | Status: AC
  Filled 2017-01-22: qty 2

## 2017-01-22 MED ORDER — ACETAMINOPHEN 650 MG RE SUPP: 650.0000 mg | Freq: Four times a day (QID) | RECTAL | Status: DC | PRN

## 2017-01-22 MED ORDER — ISOPROPYL ALCOHOL 70 % SOLN
Status: DC | PRN
  Administered 2017-01-22: 1 via TOPICAL

## 2017-01-22 MED ORDER — METHOCARBAMOL 500 MG PO TABS
500.0000 mg | ORAL_TABLET | Freq: Four times a day (QID) | ORAL | Status: DC | PRN
  Administered 2017-01-23: 500 mg via ORAL
  Filled 2017-01-22: qty 1

## 2017-01-22 MED ORDER — PHENOL 1.4 % MT LIQD: 1.0000 | OROMUCOSAL | Status: DC | PRN

## 2017-01-22 MED ORDER — DIPHENHYDRAMINE HCL 50 MG/ML IJ SOLN
INTRAMUSCULAR | Status: AC
  Filled 2017-01-22: qty 1

## 2017-01-22 MED ORDER — SODIUM CHLORIDE 0.9 % IJ SOLN
INTRAMUSCULAR | Status: DC | PRN
  Administered 2017-01-22: 30 mL

## 2017-01-22 MED ORDER — KETOROLAC TROMETHAMINE 30 MG/ML IJ SOLN
INTRAMUSCULAR | Status: DC | PRN
  Administered 2017-01-22: 30 mg

## 2017-01-22 MED ORDER — METOCLOPRAMIDE HCL 5 MG PO TABS: 5.0000 mg | ORAL_TABLET | Freq: Three times a day (TID) | ORAL | Status: DC | PRN

## 2017-01-22 MED ORDER — CEFAZOLIN SODIUM-DEXTROSE 1-4 GM/50ML-% IV SOLN
1.0000 g | Freq: Four times a day (QID) | INTRAVENOUS | Status: AC
  Administered 2017-01-22 (×2): 1 g via INTRAVENOUS
  Filled 2017-01-22 (×2): qty 50

## 2017-01-22 MED ORDER — ALBUTEROL SULFATE HFA 108 (90 BASE) MCG/ACT IN AERS
INHALATION_SPRAY | RESPIRATORY_TRACT | Status: AC
  Filled 2017-01-22: qty 6.7

## 2017-01-22 MED ORDER — ROCURONIUM BROMIDE 50 MG/5ML IV SOSY
PREFILLED_SYRINGE | INTRAVENOUS | Status: AC
  Filled 2017-01-22: qty 5

## 2017-01-22 MED ORDER — PROMETHAZINE HCL 25 MG/ML IJ SOLN: 6.2500 mg | INTRAMUSCULAR | Status: DC | PRN

## 2017-01-22 MED ORDER — HYDROMORPHONE HCL-NACL 0.5-0.9 MG/ML-% IV SOSY
PREFILLED_SYRINGE | INTRAVENOUS | Status: AC
  Filled 2017-01-22: qty 4

## 2017-01-22 MED ORDER — SUCCINYLCHOLINE CHLORIDE 200 MG/10ML IV SOSY
PREFILLED_SYRINGE | INTRAVENOUS | Status: AC
  Filled 2017-01-22: qty 10

## 2017-01-22 MED ORDER — DEXAMETHASONE SODIUM PHOSPHATE 10 MG/ML IJ SOLN
10.0000 mg | Freq: Once | INTRAMUSCULAR | Status: AC
Start: 2017-01-23 — End: 2017-01-23
  Administered 2017-01-23: 10 mg via INTRAVENOUS
  Filled 2017-01-22: qty 1

## 2017-01-22 MED ORDER — ACETAMINOPHEN 10 MG/ML IV SOLN
1000.0000 mg | INTRAVENOUS | Status: AC
  Administered 2017-01-22: 1000 mg via INTRAVENOUS
  Filled 2017-01-22: qty 100

## 2017-01-22 MED ORDER — BUPIVACAINE-EPINEPHRINE (PF) 0.25% -1:200000 IJ SOLN
INTRAMUSCULAR | Status: AC
  Filled 2017-01-22: qty 30

## 2017-01-22 MED ORDER — GUAIFENESIN ER 1200 MG PO TB12: 1.0000 | ORAL_TABLET | Freq: Every evening | ORAL | Status: DC | PRN

## 2017-01-22 MED ORDER — 0.9 % SODIUM CHLORIDE (POUR BTL) OPTIME
TOPICAL | Status: DC | PRN
Start: 2017-01-22 — End: 2017-01-22
  Administered 2017-01-22: 1000 mL

## 2017-01-22 MED ORDER — PROPOFOL 10 MG/ML IV BOLUS
INTRAVENOUS | Status: DC | PRN
  Administered 2017-01-22: 120 mg via INTRAVENOUS

## 2017-01-22 MED ORDER — METHOCARBAMOL 1000 MG/10ML IJ SOLN
500.0000 mg | Freq: Four times a day (QID) | INTRAVENOUS | Status: DC | PRN
  Administered 2017-01-22: 500 mg via INTRAVENOUS
  Filled 2017-01-22: qty 550

## 2017-01-22 MED ORDER — MIDAZOLAM HCL 5 MG/5ML IJ SOLN
INTRAMUSCULAR | Status: DC | PRN
  Administered 2017-01-22: 2 mg via INTRAVENOUS

## 2017-01-22 MED ORDER — MENTHOL 3 MG MT LOZG: 1.0000 | LOZENGE | OROMUCOSAL | Status: DC | PRN

## 2017-01-22 MED ORDER — DIPHENHYDRAMINE HCL 50 MG/ML IJ SOLN
25.0000 mg | Freq: Once | INTRAMUSCULAR | Status: AC
  Administered 2017-01-22: 25 mg via INTRAVENOUS

## 2017-01-22 MED ORDER — LACTATED RINGERS IV SOLN: INTRAVENOUS | Status: DC

## 2017-01-22 MED ORDER — LORATADINE 10 MG PO TABS
10.0000 mg | ORAL_TABLET | Freq: Every day | ORAL | Status: DC
  Filled 2017-01-22: qty 1

## 2017-01-22 MED ORDER — ONDANSETRON HCL 4 MG/2ML IJ SOLN
4.0000 mg | Freq: Four times a day (QID) | INTRAMUSCULAR | Status: DC | PRN
  Administered 2017-01-22: 4 mg via INTRAVENOUS
  Filled 2017-01-22: qty 2

## 2017-01-22 MED ORDER — DEXAMETHASONE SODIUM PHOSPHATE 10 MG/ML IJ SOLN
INTRAMUSCULAR | Status: AC
  Filled 2017-01-22: qty 1

## 2017-01-22 MED ORDER — ONDANSETRON HCL 4 MG/2ML IJ SOLN
INTRAMUSCULAR | Status: DC | PRN
  Administered 2017-01-22: 4 mg via INTRAVENOUS

## 2017-01-22 MED ORDER — DEXAMETHASONE SODIUM PHOSPHATE 10 MG/ML IJ SOLN
INTRAMUSCULAR | Status: DC | PRN
  Administered 2017-01-22: 10 mg via INTRAVENOUS

## 2017-01-22 MED ORDER — ONDANSETRON HCL 4 MG/2ML IJ SOLN
INTRAMUSCULAR | Status: AC
  Filled 2017-01-22: qty 2

## 2017-01-22 MED ORDER — FLUTICASONE FUROATE-VILANTEROL 100-25 MCG/INH IN AEPB
1.0000 | INHALATION_SPRAY | Freq: Every day | RESPIRATORY_TRACT | Status: DC
  Filled 2017-01-22: qty 28

## 2017-01-22 MED ORDER — PROPOFOL 10 MG/ML IV BOLUS
INTRAVENOUS | Status: AC
  Filled 2017-01-22: qty 20

## 2017-01-22 MED ORDER — MIDAZOLAM HCL 2 MG/2ML IJ SOLN
INTRAMUSCULAR | Status: AC
  Filled 2017-01-22: qty 2

## 2017-01-22 MED ORDER — BUPIVACAINE-EPINEPHRINE 0.25% -1:200000 IJ SOLN
INTRAMUSCULAR | Status: DC | PRN
  Administered 2017-01-22: 30 mL

## 2017-01-22 MED ORDER — SODIUM CHLORIDE 0.9 % IV SOLN: INTRAVENOUS | Status: DC

## 2017-01-22 MED ORDER — LACTATED RINGERS IV SOLN
INTRAVENOUS | Status: DC
  Administered 2017-01-22 (×2): via INTRAVENOUS

## 2017-01-22 MED ORDER — TRANEXAMIC ACID 1000 MG/10ML IV SOLN
1000.0000 mg | INTRAVENOUS | Status: AC
  Administered 2017-01-22: 1000 mg via INTRAVENOUS
  Filled 2017-01-22: qty 1100

## 2017-01-22 MED ORDER — TRANEXAMIC ACID 1000 MG/10ML IV SOLN
1000.0000 mg | Freq: Once | INTRAVENOUS | Status: AC
  Administered 2017-01-22: 1000 mg via INTRAVENOUS
  Filled 2017-01-22: qty 1100

## 2017-01-22 MED ORDER — ASPIRIN 81 MG PO CHEW
81.0000 mg | CHEWABLE_TABLET | Freq: Two times a day (BID) | ORAL | Status: DC
  Administered 2017-01-22 – 2017-01-23 (×2): 81 mg via ORAL
  Filled 2017-01-22 (×2): qty 1

## 2017-01-22 MED ORDER — SODIUM CHLORIDE 0.9 % IJ SOLN
INTRAMUSCULAR | Status: AC
  Filled 2017-01-22: qty 50

## 2017-01-22 MED ORDER — ACETAMINOPHEN 325 MG PO TABS: 650.0000 mg | ORAL_TABLET | Freq: Four times a day (QID) | ORAL | Status: DC | PRN

## 2017-01-22 MED ORDER — HYDROCODONE-ACETAMINOPHEN 5-325 MG PO TABS: 1.0000 | ORAL_TABLET | ORAL | Status: DC | PRN

## 2017-01-22 MED ORDER — SENNA 8.6 MG PO TABS: 2.0000 | ORAL_TABLET | Freq: Every day | ORAL | Status: DC

## 2017-01-22 MED ORDER — LIDOCAINE 2% (20 MG/ML) 5 ML SYRINGE
INTRAMUSCULAR | Status: AC
  Filled 2017-01-22: qty 5

## 2017-01-22 MED ORDER — FENTANYL CITRATE (PF) 250 MCG/5ML IJ SOLN
INTRAMUSCULAR | Status: AC
  Filled 2017-01-22: qty 5

## 2017-01-22 MED ORDER — ALBUTEROL SULFATE (2.5 MG/3ML) 0.083% IN NEBU: 2.5000 mg | INHALATION_SOLUTION | Freq: Four times a day (QID) | RESPIRATORY_TRACT | Status: DC | PRN

## 2017-01-22 MED ORDER — EPHEDRINE 5 MG/ML INJ
INTRAVENOUS | Status: AC
  Filled 2017-01-22: qty 10

## 2017-01-22 MED ORDER — SUGAMMADEX SODIUM 200 MG/2ML IV SOLN
INTRAVENOUS | Status: DC | PRN
  Administered 2017-01-22: 150 mg via INTRAVENOUS

## 2017-01-22 SURGICAL SUPPLY — 44 items
BAG DECANTER FOR FLEXI CONT (MISCELLANEOUS) IMPLANT
BAG ZIPLOCK 12X15 (MISCELLANEOUS) IMPLANT
CAPT HIP TOTAL 2 ×2 IMPLANT
CHLORAPREP W/TINT 26ML (MISCELLANEOUS) ×2 IMPLANT
CLOTH BEACON ORANGE TIMEOUT ST (SAFETY) ×2 IMPLANT
COVER PERINEAL POST (MISCELLANEOUS) ×2 IMPLANT
COVER SURGICAL LIGHT HANDLE (MISCELLANEOUS) ×2 IMPLANT
DECANTER SPIKE VIAL GLASS SM (MISCELLANEOUS) ×2 IMPLANT
DERMABOND ADVANCED (GAUZE/BANDAGES/DRESSINGS) ×2
DERMABOND ADVANCED .7 DNX12 (GAUZE/BANDAGES/DRESSINGS) ×2 IMPLANT
DRAPE SHEET LG 3/4 BI-LAMINATE (DRAPES) ×6 IMPLANT
DRAPE STERI IOBAN 125X83 (DRAPES) ×2 IMPLANT
DRAPE U-SHAPE 47X51 STRL (DRAPES) ×4 IMPLANT
DRESSING AQUACEL AG SP 3.5X10 (GAUZE/BANDAGES/DRESSINGS) ×1 IMPLANT
DRSG AQUACEL AG ADV 3.5X10 (GAUZE/BANDAGES/DRESSINGS) ×2 IMPLANT
DRSG AQUACEL AG SP 3.5X10 (GAUZE/BANDAGES/DRESSINGS) ×2
ELECT PENCIL ROCKER SW 15FT (MISCELLANEOUS) ×2 IMPLANT
ELECT REM PT RETURN 15FT ADLT (MISCELLANEOUS) ×2 IMPLANT
GAUZE SPONGE 4X4 12PLY STRL (GAUZE/BANDAGES/DRESSINGS) ×2 IMPLANT
GLOVE BIO SURGEON STRL SZ8.5 (GLOVE) ×4 IMPLANT
GLOVE BIOGEL PI IND STRL 8.5 (GLOVE) ×1 IMPLANT
GLOVE BIOGEL PI INDICATOR 8.5 (GLOVE) ×1
GOWN SPEC L3 XXLG W/TWL (GOWN DISPOSABLE) ×2 IMPLANT
HANDPIECE INTERPULSE COAX TIP (DISPOSABLE) ×1
HOLDER FOLEY CATH W/STRAP (MISCELLANEOUS) IMPLANT
HOOD PEEL AWAY FLYTE STAYCOOL (MISCELLANEOUS) ×4 IMPLANT
MARKER SKIN DUAL TIP RULER LAB (MISCELLANEOUS) ×2 IMPLANT
NEEDLE SPNL 18GX3.5 QUINCKE PK (NEEDLE) ×2 IMPLANT
PACK ANTERIOR HIP CUSTOM (KITS) ×2 IMPLANT
SAW OSC TIP CART 19.5X105X1.3 (SAW) ×2 IMPLANT
SEALER BIPOLAR AQUA 6.0 (INSTRUMENTS) ×2 IMPLANT
SET HNDPC FAN SPRY TIP SCT (DISPOSABLE) ×1 IMPLANT
SUT ETHIBOND NAB CT1 #1 30IN (SUTURE) ×4 IMPLANT
SUT MNCRL AB 3-0 PS2 18 (SUTURE) ×2 IMPLANT
SUT MON AB 2-0 CT1 36 (SUTURE) ×4 IMPLANT
SUT STRATAFIX PDO 1 14 VIOLET (SUTURE) ×1
SUT STRATFX PDO 1 14 VIOLET (SUTURE) ×1
SUT VIC AB 2-0 CT1 27 (SUTURE) ×1
SUT VIC AB 2-0 CT1 TAPERPNT 27 (SUTURE) ×1 IMPLANT
SUTURE STRATFX PDO 1 14 VIOLET (SUTURE) ×1 IMPLANT
SYR 50ML LL SCALE MARK (SYRINGE) ×2 IMPLANT
TRAY FOLEY W/METER SILVER 16FR (SET/KITS/TRAYS/PACK) IMPLANT
WATER STERILE IRR 1500ML POUR (IV SOLUTION) ×4 IMPLANT
YANKAUER SUCT BULB TIP 10FT TU (MISCELLANEOUS) ×2 IMPLANT

## 2017-01-22 NOTE — Anesthesia Procedure Notes (Signed)
Procedure Name: Intubation Date/Time: 01/22/2017 10:36 AM Performed by: Anne Fu Pre-anesthesia Checklist: Patient identified, Emergency Drugs available, Suction available, Patient being monitored and Timeout performed Patient Re-evaluated:Patient Re-evaluated prior to induction Oxygen Delivery Method: Circle system utilized Preoxygenation: Pre-oxygenation with 100% oxygen Induction Type: IV induction Ventilation: Mask ventilation without difficulty Laryngoscope Size: Mac and 3 Grade View: Grade I Tube type: Oral Tube size: 7.5 mm Number of attempts: 1 Airway Equipment and Method: Stylet Placement Confirmation: ETT inserted through vocal cords under direct vision,  positive ETCO2 and breath sounds checked- equal and bilateral Secured at: 19 cm Tube secured with: Tape Dental Injury: Teeth and Oropharynx as per pre-operative assessment

## 2017-01-22 NOTE — Anesthesia Preprocedure Evaluation (Addendum)
Anesthesia Evaluation  Patient identified by MRN, date of birth, ID band Patient awake    Reviewed: Allergy & Precautions, NPO status , Patient's Chart, lab work & pertinent test results  Airway Mallampati: I  TM Distance: >3 FB Neck ROM: Full    Dental  (+) Teeth Intact, Dental Advisory Given   Pulmonary asthma ,    breath sounds clear to auscultation       Cardiovascular negative cardio ROS   Rhythm:Regular Rate:Normal     Neuro/Psych negative neurological ROS     GI/Hepatic Neg liver ROS, GERD  ,  Endo/Other  negative endocrine ROS  Renal/GU negative Renal ROS     Musculoskeletal negative musculoskeletal ROS (+)   Abdominal   Peds  Hematology negative hematology ROS (+)   Anesthesia Other Findings Day of surgery medications reviewed with the patient.  Reproductive/Obstetrics                            Anesthesia Physical Anesthesia Plan  ASA: II  Anesthesia Plan: General   Post-op Pain Management:    Induction: Intravenous  PONV Risk Score and Plan: 4 or greater and Ondansetron, Dexamethasone, Midazolam, Scopolamine patch - Pre-op and Treatment may vary due to age or medical condition  Airway Management Planned: Oral ETT  Additional Equipment:   Intra-op Plan:   Post-operative Plan: Extubation in OR  Informed Consent: I have reviewed the patients History and Physical, chart, labs and discussed the procedure including the risks, benefits and alternatives for the proposed anesthesia with the patient or authorized representative who has indicated his/her understanding and acceptance.   Dental advisory given  Plan Discussed with: CRNA  Anesthesia Plan Comments:         Anesthesia Quick Evaluation

## 2017-01-22 NOTE — Interval H&P Note (Signed)
History and Physical Interval Note:  01/22/2017 9:44 AM  Jenny Sampson  has presented today for surgery, with the diagnosis of Avascular necrosis right hip  The various methods of treatment have been discussed with the patient and family. After consideration of risks, benefits and other options for treatment, the patient has consented to  Procedure(s) with comments: RIGHT TOTAL HIP ARTHROPLASTY ANTERIOR APPROACH (Right) - Needs RNFA as a surgical intervention .  The patient's history has been reviewed, patient examined, no change in status, stable for surgery.  I have reviewed the patient's chart and labs.  Questions were answered to the patient's satisfaction.     Jenny Sampson, Cloyde Reams

## 2017-01-22 NOTE — Op Note (Signed)
OPERATIVE REPORT  SURGEON: Samson Frederic, MD   ASSISTANT: Skip Mayer, PA-C.  PREOPERATIVE DIAGNOSIS: Right hip avascular necrosis.   POSTOPERATIVE DIAGNOSIS: Right hip avascular necrosis.  PROCEDURE: Right total hip arthroplasty, anterior approach.   IMPLANTS: DePuy Tri Lock stem, size 2, std offset. DePuy Pinnacle Cup, size 48 mm. DePuy Altrx liner, size 28 by 48 mm, neutral. DePuy Biolox ceramic head ball, size 28 + 1.5 mm.  ANESTHESIA:  General  ESTIMATED BLOOD LOSS: 150 mL.    ANTIBIOTICS: 2 g Ancef.  DRAINS: None.  COMPLICATIONS: None.   CONDITION: PACU - hemodynamically stable.   BRIEF CLINICAL NOTE: Jenny Sampson is a 41 y.o. female with a long-standing history of Right hip avascular necrosis. After failing conservative management, the patient was indicated for total hip arthroplasty. The risks, benefits, and alternatives to the procedure were explained, and the patient elected to proceed.  PROCEDURE IN DETAIL: Surgical site was marked by myself in the pre-op holding area. Once inside the operating room, spinal anesthesia was obtained, and a foley catheter was inserted. The patient was then positioned on the Hana table. All bony prominences were well padded. The hip was prepped and draped in the normal sterile surgical fashion. A time-out was called verifying side and site of surgery. The patient received IV antibiotics within 60 minutes of beginning the procedure.  The direct anterior approach to the hip was performed through the Hueter interval. Lateral femoral circumflex vessels were treated with the Auqumantys. The anterior capsule was exposed and an inverted T capsulotomy was made.The femoral neck cut was made to the level of the templated cut. A corkscrew was placed into the head and the head was removed. The femoral head was found to have collapse. The head was passed to the back table and was measured.  Acetabular exposure was achieved, and the  pulvinar and labrum were excised. Sequential reaming of the acetabulum was then performed up to a size 47 mm reamer. A 48 mm cup was then opened and impacted into place at approximately 40 degrees of abduction and 20 degrees of anteversion. The final polyethylene liner was impacted into place and acetabular osteophytes were removed.   I then gained femoral exposure taking care to protect the abductors and greater trochanter. This was performed using standard external rotation, extension, and adduction. The capsule was peeled off the inner aspect of the greater trochanter, taking care to preserve the short external rotators. A cookie cutter was used to enter the femoral canal, and then the femoral canal finder was placed. Sequential broaching was performed up to a size 2. Calcar planer was used on the femoral neck remnant. I placed a std offset neck and a trial head ball. The hip was reduced. Leg lengths and offset were checked fluoroscopically. The hip was dislocated and trial components were removed. The final implants were placed, and the hip was reduced.  Fluoroscopy was used to confirm component position and leg lengths. At 90 degrees of external rotation and full extension, the hip was stable to an anterior directed force.  The wound was copiously irrigated with normal saline using pulse lavage. Marcaine solution was injected into the periarticular soft tissue. The wound was closed in layers using #1 Vicryl and V-Loc for the fascia, 2-0 Vicryl for the subcutaneous fat, 2-0 Monocryl for the deep dermal layer, 3-0 running Monocryl subcuticular stitch, and Dermabond for the skin. Once the glue was fully dried, an Aquacell Ag dressing was applied. The patient was transported to the  recovery room in stable condition. Sponge, needle, and instrument counts were correct at the end of the case x2. The patient tolerated the procedure well and there were no known complications.  Please note that a  surgical assistant was a medical necessity for this procedure to perform it in a safe and expeditious manner. Assistant was necessary to provide appropriate retraction of vital neurovascular structures, to prevent femoral fracture, and to allow for anatomic placement of the prosthesis.

## 2017-01-22 NOTE — Anesthesia Postprocedure Evaluation (Signed)
Anesthesia Post Note  Patient: Jenny Sampson  Procedure(s) Performed: Procedure(s) (LRB): RIGHT TOTAL HIP ARTHROPLASTY ANTERIOR APPROACH (Right)     Patient location during evaluation: PACU Anesthesia Type: General Level of consciousness: awake and alert Pain management: pain level controlled Vital Signs Assessment: post-procedure vital signs reviewed and stable Respiratory status: spontaneous breathing, nonlabored ventilation, respiratory function stable and patient connected to nasal cannula oxygen Cardiovascular status: blood pressure returned to baseline and stable Postop Assessment: no apparent nausea or vomiting Anesthetic complications: no    Last Vitals:  Vitals:   01/22/17 1443 01/22/17 1450  BP: (!) 124/53 (!) 124/53  Pulse:  64  Resp: 16 16  Temp: 36.6 C 36.6 C  SpO2: 96% 96%    Last Pain:  Vitals:   01/22/17 1450  TempSrc: Oral  PainSc:                  Shelton Silvas

## 2017-01-22 NOTE — Transfer of Care (Signed)
Immediate Anesthesia Transfer of Care Note  Patient: Jenny Sampson  Procedure(s) Performed: Procedure(s) with comments: RIGHT TOTAL HIP ARTHROPLASTY ANTERIOR APPROACH (Right) - Needs RNFA  Patient Location: PACU  Anesthesia Type:General  Level of Consciousness:  sedated, patient cooperative and responds to stimulation  Airway & Oxygen Therapy:Patient Spontanous Breathing and Patient connected to face mask oxgen  Post-op Assessment:  Report given to PACU RN and Post -op Vital signs reviewed and stable  Post vital signs:  Reviewed and stable  Last Vitals:  Vitals:   01/22/17 0833  BP: 112/70  Pulse: (!) 53  Resp: 16  Temp: 37.1 C  SpO2: 100%    Complications: No apparent anesthesia complications

## 2017-01-22 NOTE — H&P (View-Only) (Signed)
TOTAL HIP ADMISSION H&P  Patient is admitted for right total hip arthroplasty.  Subjective:  Chief Complaint: right hip pain  HPI: Jenny Sampson, 41 y.o. female, has a history of pain and functional disability in the right hip(s) due to AVN and patient has failed non-surgical conservative treatments for greater than 12 weeks to include NSAID's and/or analgesics, flexibility and strengthening excercises, use of assistive devices and activity modification.  Onset of symptoms was gradual starting 2 years ago with rapidlly worsening course since that time.The patient noted no past surgery on the right hip(s).  Patient currently rates pain in the right hip at 10 out of 10 with activity. Patient has night pain, worsening of pain with activity and weight bearing, pain that interfers with activities of daily living, pain with passive range of motion and crepitus. Patient has evidence of AVN by imaging studies. This condition presents safety issues increasing the risk of falls.  There is no current active infection.  Patient Active Problem List   Diagnosis Date Noted  . Allergic rhinoconjunctivitis 12/26/2014  . Avascular necrosis of bone of left hip (HCC) 12/07/2014  . Avascular necrosis of hip (HCC) 12/07/2014   Past Medical History:  Diagnosis Date  . Asthma    seasonal pt states rarely has use inhaler   . GERD (gastroesophageal reflux disease)   . Hearing loss    bilat   . Ovarian cyst   . Perforated eardrum    bilat     Past Surgical History:  Procedure Laterality Date  . NASAL SINUS SURGERY    . TOTAL HIP ARTHROPLASTY Left 12/07/2014   Procedure: LEFT TOTAL HIP ARTHROPLASTY ANTERIOR APPROACH;  Surgeon: Hilliary Jock, MD;  Location: [facility];  Service: Orthopedics;  Laterality: Left;     (Not in a hospital admission) Allergies  Allergen Reactions  . Other Itching and Rash    Tree nuts: Peacns    Social History  Substance Use Topics  . Smoking status: Never Smoker  . Smokeless  tobacco: Never Used  . Alcohol use No    No family history on file.   Review of Systems  Constitutional: Negative.   HENT: Positive for hearing loss.   Eyes: Negative.   Respiratory: Negative.   Cardiovascular: Negative.   Gastrointestinal: Negative.   Genitourinary: Negative.   Musculoskeletal: Positive for joint pain.  Skin: Negative.   Neurological: Negative.   Endo/Heme/Allergies: Positive for environmental allergies.  Psychiatric/Behavioral: Negative.     Objective:  Physical Exam  Vitals reviewed. Constitutional: She is oriented to person, place, and time. She appears well-developed and well-nourished.  HENT:  Head: Normocephalic and atraumatic.  Eyes: Pupils are equal, round, and reactive to light. Conjunctivae and EOM are normal.  Neck: Normal range of motion. Neck supple.  Cardiovascular: Normal rate, regular rhythm and intact distal pulses.   Respiratory: Effort normal. No respiratory distress.  GI: Soft. She exhibits no distension.  Genitourinary:  Genitourinary Comments: deferred  Musculoskeletal:       Right hip: She exhibits decreased range of motion, decreased strength and tenderness.  Neurological: She is alert and oriented to person, place, and time. She has normal reflexes.  Skin: Skin is warm and dry.  Psychiatric: She has a normal mood and affect. Her behavior is normal. Judgment and thought content normal.    Vital signs in last 24 hours: @VSRANGES@  Labs:   Estimated body mass index is 28.65 kg/m as calculated from the following:   Height as of an earlier   encounter on 01/13/17:  (1.448 m).   Weight as of an earlier encounter on 01/13/17: 60 kg (132 lb 6 oz).   Imaging Review Plain radiographs demonstrate moderate degenerative joint disease of the right hip(s). The bone quality appears to be adequate for age and reported activity level.  Assessment/Plan:  AVN, right hip(s)  The patient history, physical examination, clinical  judgement of the provider and imaging studies are consistent with end stage degenerative joint disease of the right hip(s) and total hip arthroplasty is deemed medically necessary. The treatment options including medical management, injection therapy, arthroscopy and arthroplasty were discussed at length. The risks and benefits of total hip arthroplasty were presented and reviewed. The risks due to aseptic loosening, infection, stiffness, dislocation/subluxation,  thromboembolic complications and other imponderables were discussed.  The patient acknowledged the explanation, agreed to proceed with the plan and consent was signed. Patient is being admitted for inpatient treatment for surgery, pain control, PT, OT, prophylactic antibiotics, VTE prophylaxis, progressive ambulation and ADL's and discharge planning.The patient is planning to be discharged home with HEP

## 2017-01-22 NOTE — Discharge Instructions (Signed)
°Dr. Rakan Soffer °Joint Replacement Specialist °[facility] °[address] °[place], [place] [address] °[phone] ° ° °TOTAL HIP REPLACEMENT POSTOPERATIVE DIRECTIONS ° ° ° °Hip Rehabilitation, Guidelines Following Surgery  ° °WEIGHT BEARING °Weight bearing as tolerated with assist device (walker, cane, etc) as directed, use it as long as suggested by your surgeon or therapist, typically at least 4-6 weeks. ° °The results of a hip operation are greatly improved after range of motion and muscle strengthening exercises. Follow all safety measures which are given to protect your hip. If any of these exercises cause increased pain or swelling in your joint, decrease the amount until you are comfortable again. Then slowly increase the exercises. Call your caregiver if you have problems or questions.  ° °HOME CARE INSTRUCTIONS  °Most of the following instructions are designed to prevent the dislocation of your new hip.  °Remove items at home which could result in a fall. This includes throw rugs or furniture in walking pathways.  °Continue medications as instructed at time of discharge. °· You may have some home medications which will be placed on hold until you complete the course of blood thinner medication. °· You may start showering once you are discharged home. Do not remove your dressing. °Do not put on socks or shoes without following the instructions of your caregivers.   °Sit on chairs with arms. Use the chair arms to help push yourself up when arising.  °Arrange for the use of a toilet seat elevator so you are not sitting low.  °· Walk with walker as instructed.  °You may resume a sexual relationship in one month or when given the OK by your caregiver.  °Use walker as long as suggested by your caregivers.  °You may put full weight on your legs and walk as much as is comfortable. °Avoid periods of inactivity such as sitting longer than an hour when not asleep. This helps prevent  blood clots.  °You may return to work once you are cleared by your surgeon.  °Do not drive a car for 6 weeks or until released by your surgeon.  °Do not drive while taking narcotics.  °Wear elastic stockings for two weeks following surgery during the day but you may remove then at night.  °Make sure you keep all of your appointments after your operation with all of your doctors and caregivers. You should call the office at the above phone number and make an appointment for approximately two weeks after the date of your surgery. °Please pick up a stool softener and laxative for home use as long as you are requiring pain medications. °· ICE to the affected hip every three hours for 30 minutes at a time and then as needed for pain and swelling. Continue to use ice on the hip for pain and swelling from surgery. You may notice swelling that will progress down to the foot and ankle.  This is normal after surgery.  Elevate the leg when you are not up walking on it.   °It is important for you to complete the blood thinner medication as prescribed by your doctor. °· Continue to use the breathing machine which will help keep your temperature down.  It is common for your temperature to cycle up and down following surgery, especially at night when you are not up moving around and exerting yourself.  The breathing machine keeps your lungs expanded and your temperature down. ° °RANGE OF MOTION AND STRENGTHENING EXERCISES  °These exercises are   designed to help you keep full movement of your hip joint. Follow your caregiver's or physical therapist's instructions. Perform all exercises about fifteen times, three times per day or as directed. Exercise both hips, even if you have had only one joint replacement. These exercises can be done on a training (exercise) mat, on the floor, on a table or on a bed. Use whatever works the best and is most comfortable for you. Use music or television while you are exercising so that the exercises  are a pleasant break in your day. This will make your life better with the exercises acting as a break in routine you can look forward to.  °Lying on your back, slowly slide your foot toward your buttocks, raising your knee up off the floor. Then slowly slide your foot back down until your leg is straight again.  °Lying on your back spread your legs as far apart as you can without causing discomfort.  °Lying on your side, raise your upper leg and foot straight up from the floor as far as is comfortable. Slowly lower the leg and repeat.  °Lying on your back, tighten up the muscle in the front of your thigh (quadriceps muscles). You can do this by keeping your leg straight and trying to raise your heel off the floor. This helps strengthen the largest muscle supporting your knee.  °Lying on your back, tighten up the muscles of your buttocks both with the legs straight and with the knee bent at a comfortable angle while keeping your heel on the floor.  ° °SKILLED REHAB INSTRUCTIONS: °If the patient is transferred to a skilled rehab facility following release from the hospital, a list of the current medications will be sent to the facility for the patient to continue.  When discharged from the skilled rehab facility, please have the facility set up the patient's Home Health Physical Therapy prior to being released. Also, the skilled facility will be responsible for providing the patient with their medications at time of release from the facility to include their pain medication and their blood thinner medication. If the patient is still at the rehab facility at time of the two week follow up appointment, the skilled rehab facility will also need to assist the patient in arranging follow up appointment in our office and any transportation needs. ° °MAKE SURE YOU:  °Understand these instructions.  °Will watch your condition.  °Will get help right away if you are not doing well or get worse. ° °Pick up stool softner and  laxative for home use following surgery while on pain medications. °Do not remove your dressing. °The dressing is waterproof--it is OK to take showers. °Continue to use ice for pain and swelling after surgery. °Do not use any lotions or creams on the incision until instructed by your surgeon. °Total Hip Protocol. ° ° °

## 2017-01-23 LAB — BASIC METABOLIC PANEL
Anion gap: 7 (ref 5–15)
BUN: 7 mg/dL (ref 6–20)
CALCIUM: 7.8 mg/dL — AB (ref 8.9–10.3)
CHLORIDE: 110 mmol/L (ref 101–111)
CO2: 21 mmol/L — ABNORMAL LOW (ref 22–32)
CREATININE: 0.67 mg/dL (ref 0.44–1.00)
Glucose, Bld: 159 mg/dL — ABNORMAL HIGH (ref 65–99)
POTASSIUM: 3.7 mmol/L (ref 3.5–5.1)
SODIUM: 138 mmol/L (ref 135–145)

## 2017-01-23 LAB — CBC
HEMATOCRIT: 29.7 % — AB (ref 36.0–46.0)
HEMOGLOBIN: 10.3 g/dL — AB (ref 12.0–15.0)
MCH: 30.7 pg (ref 26.0–34.0)
MCHC: 34.7 g/dL (ref 30.0–36.0)
MCV: 88.4 fL (ref 78.0–100.0)
Platelets: 189 10*3/uL (ref 150–400)
RBC: 3.36 MIL/uL — AB (ref 3.87–5.11)
RDW: 12.8 % (ref 11.5–15.5)
WBC: 13 10*3/uL — ABNORMAL HIGH (ref 4.0–10.5)

## 2017-01-23 MED ORDER — HYDROCODONE-ACETAMINOPHEN 5-325 MG PO TABS: 1.0000 | ORAL_TABLET | ORAL | 0 refills | Status: AC | PRN

## 2017-01-23 MED ORDER — ONDANSETRON HCL 4 MG PO TABS: 4.0000 mg | ORAL_TABLET | Freq: Four times a day (QID) | ORAL | 0 refills | Status: AC | PRN

## 2017-01-23 MED ORDER — ASPIRIN 81 MG PO CHEW: 81.0000 mg | CHEWABLE_TABLET | Freq: Two times a day (BID) | ORAL | 1 refills | Status: AC

## 2017-01-23 MED ORDER — MELOXICAM 15 MG PO TABS: 15.0000 mg | ORAL_TABLET | Freq: Every day | ORAL | 3 refills | Status: AC

## 2017-01-23 MED ORDER — SODIUM CHLORIDE 0.9 % IV BOLUS (SEPSIS)
500.0000 mL | Freq: Once | INTRAVENOUS | Status: AC
  Administered 2017-01-23: 500 mL via INTRAVENOUS

## 2017-01-23 NOTE — Evaluation (Signed)
Physical Therapy Evaluation Patient Details Name: Jenny Sampson MRN: [id number] DOB: 09-11-1975 Today's Date: 01/23/2017   History of Present Illness  [name]  Clinical Impression  The patient is mobilizing very well. Will return and ambulate, HEP instruction And steps. Pt admitted with above diagnosis. Pt currently with functional limitations due to the deficits listed below (see PT Problem List). Pt will benefit from skilled PT to increase their independence and safety with mobility to allow discharge to the venue listed below.       Follow Up Recommendations DC plan and follow up therapy as arranged by surgeon    Equipment Recommendations  None recommended by PT    Recommendations for Other Services       Precautions / Restrictions Precautions Precautions: Fall Restrictions Weight Bearing Restrictions: No      Mobility  Bed Mobility Overal bed mobility: Modified Independent                Transfers Overall transfer level: Modified independent Equipment used: Rolling walker (2 wheeled)                Ambulation/Gait Ambulation/Gait assistance: Supervision Ambulation Distance (Feet): 40 Feet Assistive device: Rolling walker (2 wheeled) Gait Pattern/deviations: Step-through pattern        Stairs            Wheelchair Mobility    Modified Rankin (Stroke Patients Only)       Balance                                             Pertinent Vitals/Pain Pain Assessment: 0-10 Pain Score: 4  Pain Descriptors / Indicators: Sore Pain Intervention(s): Monitored during session;Repositioned    Home Living Family/patient expects to be discharged to:: Private residence Living Arrangements: Spouse/significant other Available Help at Discharge: Family Type of Home: House Home Access: Stairs to enter   Secretary/administrator of Steps: 1   Home Equipment: Environmental consultant - 2 wheels      Prior Function Level of Independence:  Independent               Hand Dominance        Extremity/Trunk Assessment   Upper Extremity Assessment Upper Extremity Assessment: Overall WFL for tasks assessed    Lower Extremity Assessment Lower Extremity Assessment: RLE deficits/detail RLE Deficits / Details: able to perform full knee extension and advance the leg       Communication   Communication: No difficulties  Cognition Arousal/Alertness: Awake/alert Behavior During Therapy: WFL for tasks assessed/performed Overall Cognitive Status: Within Functional Limits for tasks assessed                                        General Comments      Exercises     Assessment/Plan    PT Assessment Patient needs continued PT services  PT Problem List Decreased strength;Decreased range of motion;Pain       PT Treatment Interventions DME instruction;Gait training;Functional mobility training;Therapeutic activities;Therapeutic exercise;Patient/family education    PT Goals (Current goals can be found in the Care Plan section)  Acute Rehab PT Goals Patient Stated Goal: go home PT Goal Formulation: With patient/family Time For Goal Achievement: 01/24/17 Potential to Achieve Goals: Good    Frequency 7X/week  Barriers to discharge        Co-evaluation               AM-PAC PT "6 Clicks" Daily Activity  Outcome Measure Difficulty turning over in bed (including adjusting bedclothes, sheets and blankets)?: None Difficulty moving from lying on back to sitting on the side of the bed? : None Difficulty sitting down on and standing up from a chair with arms (e.g., wheelchair, bedside commode, etc,.)?: None Help needed moving to and from a bed to chair (including a wheelchair)?: None Help needed walking in hospital room?: A Little Help needed climbing 3-5 steps with a railing? : A Little 6 Click Score: 22    End of Session   Activity Tolerance: Patient tolerated treatment well Patient left:   (in bathroom with spouse guarding) Nurse Communication: Mobility status PT Visit Diagnosis: Difficulty in walking, not elsewhere classified (R26.2)    Time: 1610-9604 PT Time Calculation (min) (ACUTE ONLY): 10 min   Charges:   PT Evaluation $PT Eval Low Complexity: 1 Low     PT G Codes:        {  Jenny Sampson 01/23/2017, 10:25 AM

## 2017-01-23 NOTE — Progress Notes (Signed)
01/23/17 0145 Nursing Alphonsa Overall PA called regarding patient's low bp at this time. Order received for 500cc bolus.

## 2017-01-23 NOTE — Progress Notes (Signed)
Physical Therapy Treatment Patient Details Name: Jenny Sampson MRN: [id number] DOB: [birth year] Today's Date: 01/23/2017    History of Present Illness [name]    PT Comments    The patient is ready for DC.   Follow Up Recommendations  DC plan and follow up therapy as arranged by surgeon     Equipment Recommendations  None recommended by PT    Recommendations for Other Services       Precautions / Restrictions Precautions Precautions: Fall Restrictions Weight Bearing Restrictions: No    Mobility  Bed Mobility Overal bed mobility: Modified Independent                Transfers Overall transfer level: Modified independent Equipment used: Rolling walker (2 wheeled)                Ambulation/Gait Ambulation/Gait assistance: Supervision Ambulation Distance (Feet): 300 Feet Assistive device: Rolling walker (2 wheeled) Gait Pattern/deviations: Step-through pattern         Stairs Stairs: Yes   Stair Management: One rail Right;Step to pattern;Forwards;With cane Number of Stairs: 3    Wheelchair Mobility    Modified Rankin (Stroke Patients Only)       Balance                                            Cognition Arousal/Alertness: Awake/alert Behavior During Therapy: WFL for tasks assessed/performed Overall Cognitive Status: Within Functional Limits for tasks assessed                                        Exercises Total Joint Exercises Quad Sets: AROM;Right;10 reps Short Arc Quad: AROM;Right;10 reps Heel Slides: AROM;Right;10 reps Hip ABduction/ADduction: AROM;Right;10 reps Long Arc Quad: AROM;Right;10 reps    General Comments        Pertinent Vitals/Pain Pain Assessment: 0-10 Pain Score: 2  Pain Location: right thigh Pain Descriptors / Indicators: Sore Pain Intervention(s): Premedicated before session    Home Living Family/patient expects to be discharged to:: Private residence Living  Arrangements: Spouse/significant other Available Help at Discharge: Family Type of Home: House Home Access: Stairs to enter     Home Equipment: Environmental consultant - 2 wheels      Prior Function Level of Independence: Independent          PT Goals (current goals can now be found in the care plan section) Acute Rehab PT Goals Patient Stated Goal: go home PT Goal Formulation: With patient/family Time For Goal Achievement: 01/24/17 Potential to Achieve Goals: Good Progress towards PT goals: Progressing toward goals    Frequency    7X/week      PT Plan Current plan remains appropriate    Co-evaluation              AM-PAC PT "6 Clicks" Daily Activity  Outcome Measure  Difficulty turning over in bed (including adjusting bedclothes, sheets and blankets)?: None Difficulty moving from lying on back to sitting on the side of the bed? : None Difficulty sitting down on and standing up from a chair with arms (e.g., wheelchair, bedside commode, etc,.)?: None Help needed moving to and from a bed to chair (including a wheelchair)?: None Help needed walking in hospital room?: A Little Help needed climbing 3-5 steps with a railing? : A  Little 6 Click Score: 22    End of Session   Activity Tolerance: Patient tolerated treatment well Patient left: in chair Nurse Communication: Mobility status PT Visit Diagnosis: Difficulty in walking, not elsewhere classified (R26.2)     Time: 1610-9604 PT Time Calculation (min) (ACUTE ONLY): 17 min  Charges:  $Gait Training: 8-22 mins                    G Codes:         [name] 01/23/2017, 10:31 AM

## 2017-01-23 NOTE — Discharge Summary (Signed)
Physician Discharge Summary  Patient ID: Jenny Sampson MRN: [id number] DOB/AGE: Apr 07, 1976 40 y.o.  Admit date: 01/22/2017 Discharge date: 01/23/2017  Admission Diagnoses:  Avascular necrosis of hip, right [facility])  Discharge Diagnoses:  Principal Problem:   Avascular necrosis of hip, right [facility])   Past Medical History:  Diagnosis Date  . Asthma    seasonal pt states rarely has use inhaler   . GERD (gastroesophageal reflux disease)   . Hearing loss    bilat   . Ovarian cyst   . Perforated eardrum    bilat     Surgeries: Procedure(s): RIGHT TOTAL HIP ARTHROPLASTY ANTERIOR APPROACH on 01/22/2017   Consultants (if any):   Discharged Condition: Improved  Hospital Course: Jenny Sampson is an 41 y.o. female who was admitted 01/22/2017 with a diagnosis of Avascular necrosis of hip, right (HCC) and went to the operating room on 01/22/2017 and underwent the above named procedures.    She was given perioperative antibiotics:  Anti-infectives    Start     Dose/Rate Route Frequency Ordered Stop   01/22/17 1700  ceFAZolin (ANCEF) IVPB 1 g/50 mL premix     1 g 100 mL/hr over 30 Minutes Intravenous Every 6 hours 01/22/17 1458 01/22/17 2252   01/22/17 0855  ceFAZolin (ANCEF) IVPB 2g/100 mL premix     2 g 200 mL/hr over 30 Minutes Intravenous On call to O.R. 01/22/17 4098 01/22/17 1104    .  She was given sequential compression devices, early ambulation, and ASA for DVT prophylaxis.  She benefited maximally from the hospital stay and there were no complications.    Recent vital signs:  Vitals:   01/23/17 0300 01/23/17 0445  BP: (!) 102/53 (!) 102/58  Pulse: (!) 53 70  Resp:  15  Temp:  97.7 F (36.5 C)  SpO2:  99%    Recent laboratory studies:  Lab Results  Component Value Date   HGB 10.3 (L) 01/23/2017   HGB 12.7 01/13/2017   HGB 10.0 (L) 12/08/2014   Lab Results  Component Value Date   WBC 13.0 (H) 01/23/2017   PLT 189 01/23/2017   Lab Results  Component Value Date    INR 0.93 11/30/2014   Lab Results  Component Value Date   NA 138 01/23/2017   K 3.7 01/23/2017   CL 110 01/23/2017   CO2 21 (L) 01/23/2017   BUN 7 01/23/2017   CREATININE 0.67 01/23/2017   GLUCOSE 159 (H) 01/23/2017    Discharge Medications:   Allergies as of 01/23/2017      Reactions   Other Itching, Rash   Tree nuts: Peacns      Medication List    STOP taking these medications   aspirin EC 325 MG tablet Replaced by:  aspirin 81 MG chewable tablet   naproxen sodium 220 MG tablet Commonly known as:  ANAPROX     TAKE these medications   albuterol (2.5 MG/3ML) 0.083% nebulizer solution Commonly known as:  PROVENTIL Take 2.5 mg by nebulization every 6 (six) hours as needed for wheezing or shortness of breath.   aspirin 81 MG chewable tablet Chew 1 tablet (81 mg total) by mouth 2 (two) times daily. Replaces:  aspirin EC 325 MG tablet   budesonide-formoterol 80-4.5 MCG/ACT inhaler Commonly known as:  SYMBICORT Inhale 2 puffs into the lungs daily as needed (shortness of breath).   cetirizine 10 MG tablet Commonly known as:  ZYRTEC Take 10 mg by mouth daily as needed.   docusate  sodium 100 MG capsule Commonly known as:  COLACE Take 1 capsule (100 mg total) by mouth 2 (two) times daily.   EPIPEN 2-PAK 0.3 mg/0.3 mL Soaj injection Generic drug:  EPINEPHrine Inject 0.3 mg into the muscle once.   HYDROcodone-acetaminophen 5-325 MG tablet Commonly known as:  NORCO/VICODIN Take 1-2 tablets by mouth every 4 (four) hours as needed (breakthrough pain). Do not exceed 10 tablets per day What changed:  reasons to take this  additional instructions   meloxicam 15 MG tablet Commonly known as:  MOBIC Take 1 tablet (15 mg total) by mouth daily.   MUCINEX MAXIMUM STRENGTH 1200 MG Tb12 Generic drug:  Guaifenesin Take 1 tablet by mouth at bedtime as needed (congestion).   ondansetron 4 MG tablet Commonly known as:  ZOFRAN Take 1 tablet (4 mg total) by mouth every 6  (six) hours as needed for nausea.   senna 8.6 MG Tabs tablet Commonly known as:  SENOKOT Take 2 tablets (17.2 mg total) by mouth at bedtime.            Discharge Care Instructions        Start     Ordered   01/23/17 0000  aspirin 81 MG chewable tablet  2 times daily     01/23/17 0640   01/23/17 0000  HYDROcodone-acetaminophen (NORCO/VICODIN) 5-325 MG tablet  Every 4 hours PRN     01/23/17 0640   01/23/17 0000  meloxicam (MOBIC) 15 MG tablet  Daily     01/23/17 0640   01/23/17 0000  ondansetron (ZOFRAN) 4 MG tablet  Every 6 hours PRN     01/23/17 0640   01/23/17 0000  Call MD / Call 911    Comments:  If you experience chest pain or shortness of breath, CALL 911 and be transported to the hospital emergency room.  If you develope a fever above 101 F, pus (white drainage) or increased drainage or redness at the wound, or calf pain, call your surgeon's office.   01/23/17 0640   01/23/17 0000  Diet - low sodium heart healthy     01/23/17 0640   01/23/17 0000  Constipation Prevention    Comments:  Drink plenty of fluids.  Prune juice may be helpful.  You may use a stool softener, such as Colace (over the counter) 100 mg twice a day.  Use MiraLax (over the counter) for constipation as needed.   01/23/17 0640   01/23/17 0000  Increase activity slowly as tolerated     01/23/17 0640   01/23/17 0000  Driving restrictions    Comments:  No driving for 6 weeks   16/10/96 0640   01/23/17 0000  Lifting restrictions    Comments:  No lifting for 6 weeks   01/23/17 0640   01/23/17 0000  TED hose    Comments:  Use stockings (TED hose) for 2 weeks on both leg(s).  You may remove them at night for sleeping.   01/23/17 0640      Diagnostic Studies: Dg Pelvis Portable  Result Date: 01/22/2017 CLINICAL DATA:  Status post right total hip arthroplasty. EXAM: PORTABLE PELVIS 1-2 VIEWS COMPARISON:  Radiographs of December 07, 2014. FINDINGS: Stable previously placed left total hip arthroplasty is  noted. There has been interval placement of right total hip arthroplasty. The femoral and acetabular components appear to be well situated. Expected postoperative changes are seen in the surrounding soft tissues. No fracture or dislocation is noted. IMPRESSION: Status post right total hip arthroplasty. Electronically  Signed   By: Lupita Raider, M.D.   On: 01/22/2017 14:00   Dg C-arm 1-60 Min-no Report  Result Date: 01/22/2017 Fluoroscopy was utilized by the requesting physician.  No radiographic interpretation.   Dg Hip Operative Unilat With Pelvis Right  Result Date: 01/22/2017 CLINICAL DATA:  Right total hip arthroplasty. EXAM: OPERATIVE RIGHT HIP (WITH PELVIS IF PERFORMED) 1 VIEW TECHNIQUE: Fluoroscopic spot image(s) were submitted for interpretation post-operatively. COMPARISON:  None. FINDINGS: Anatomic alignment of the right hip prosthesis in the AP projection. No visible complicating features. AP pelvis image shows anatomic alignment of the contralateral left hip prosthesis. IMPRESSION: Anatomic alignment of the right hip prosthesis in the AP projection. Electronically Signed   By: Hulan Saas M.D.   On: 01/22/2017 12:32    Disposition: 06-Home-Health Care Svc  Discharge Instructions    Call MD / Call 911    Complete by:  As directed    If you experience chest pain or shortness of breath, CALL 911 and be transported to the hospital emergency room.  If you develope a fever above 101 F, pus (white drainage) or increased drainage or redness at the wound, or calf pain, call your surgeon's office.   Constipation Prevention    Complete by:  As directed    Drink plenty of fluids.  Prune juice may be helpful.  You may use a stool softener, such as Colace (over the counter) 100 mg twice a day.  Use MiraLax (over the counter) for constipation as needed.   Diet - low sodium heart healthy    Complete by:  As directed    Driving restrictions    Complete by:  As directed    No driving for 6  weeks   Increase activity slowly as tolerated    Complete by:  As directed    Lifting restrictions    Complete by:  As directed    No lifting for 6 weeks   TED hose    Complete by:  As directed    Use stockings (TED hose) for 2 weeks on both leg(s).  You may remove them at night for sleeping.      Follow-up Information    Ata Pecha, Arlys John, MD. Schedule an appointment as soon as possible for a visit in 2 weeks.   Specialty:  Orthopedic Surgery Why:  For wound re-check Contact information: [address] [place] [address] [phone]            Signed: Garnet Koyanagi 01/23/2017, 6:40 AM

## 2017-01-23 NOTE — Progress Notes (Signed)
   Subjective:  Patient reports pain as mild to moderate.  Denies N/V/CP/SOB.  Objective:   VITALS:   Vitals:   01/23/17 0108 01/23/17 0142 01/23/17 0300 01/23/17 0445  BP: (!) 88/48 (!) 81/42 (!) 102/53 (!) 102/58  Pulse: (!) 52 (!) 55 (!) 53 70  Resp: 15   15  Temp: 97.8 F (36.6 C)   97.7 F (36.5 C)  TempSrc: Oral   Oral  SpO2: 97%   99%  Weight:      Height:        NAD ABD soft Sensation intact distally Intact pulses distally Dorsiflexion/Plantar flexion intact Incision: dressing C/D/I Compartment soft   Lab Results  Component Value Date   WBC 13.0 (H) 01/23/2017   HGB 10.3 (L) 01/23/2017   HCT 29.7 (L) 01/23/2017   MCV 88.4 01/23/2017   PLT 189 01/23/2017   BMET    Component Value Date/Time   NA 138 01/23/2017 0530   K 3.7 01/23/2017 0530   CL 110 01/23/2017 0530   CO2 21 (L) 01/23/2017 0530   GLUCOSE 159 (H) 01/23/2017 0530   BUN 7 01/23/2017 0530   CREATININE 0.67 01/23/2017 0530   CALCIUM 7.8 (L) 01/23/2017 0530   GFRNONAA >60 01/23/2017 0530   GFRAA >60 01/23/2017 0530     Assessment/Plan: 1 Day Post-Op   Principal Problem:   Avascular necrosis of hip, right (HCC)   WBAT with walker DVT ppx: ASA, SCDs, TEDs Hypotension: due to surgical blood loss, responded to 500 cc NS bolus, monitor PO pain control PT/OT Dispo: D/C home with HEP   Tres Grzywacz, Cloyde Reams 01/23/2017, 6:36 AM   Samson Frederic, MD Cell [phone]

## 2017-02-06 DIAGNOSIS — Z471 Aftercare following joint replacement surgery: Secondary | ICD-10-CM | POA: Diagnosis not present

## 2017-02-06 DIAGNOSIS — Z96641 Presence of right artificial hip joint: Secondary | ICD-10-CM | POA: Diagnosis not present

## 2017-03-04 DIAGNOSIS — Z96641 Presence of right artificial hip joint: Secondary | ICD-10-CM | POA: Diagnosis not present

## 2017-03-04 DIAGNOSIS — Z471 Aftercare following joint replacement surgery: Secondary | ICD-10-CM | POA: Diagnosis not present

## 2017-06-26 DIAGNOSIS — Z6826 Body mass index (BMI) 26.0-26.9, adult: Secondary | ICD-10-CM | POA: Diagnosis not present

## 2017-06-26 DIAGNOSIS — J45901 Unspecified asthma with (acute) exacerbation: Secondary | ICD-10-CM | POA: Diagnosis not present

## 2017-12-15 DIAGNOSIS — M1611 Unilateral primary osteoarthritis, right hip: Secondary | ICD-10-CM | POA: Diagnosis not present

## 2018-02-19 DIAGNOSIS — H7293 Unspecified perforation of tympanic membrane, bilateral: Secondary | ICD-10-CM | POA: Diagnosis not present

## 2018-02-19 DIAGNOSIS — H9193 Unspecified hearing loss, bilateral: Secondary | ICD-10-CM | POA: Diagnosis not present

## 2018-09-28 DIAGNOSIS — Z6827 Body mass index (BMI) 27.0-27.9, adult: Secondary | ICD-10-CM | POA: Diagnosis not present

## 2018-09-28 DIAGNOSIS — Z01419 Encounter for gynecological examination (general) (routine) without abnormal findings: Secondary | ICD-10-CM | POA: Diagnosis not present

## 2018-09-28 DIAGNOSIS — Z1331 Encounter for screening for depression: Secondary | ICD-10-CM | POA: Diagnosis not present

## 2018-09-28 DIAGNOSIS — Z13220 Encounter for screening for lipoid disorders: Secondary | ICD-10-CM | POA: Diagnosis not present

## 2018-09-28 DIAGNOSIS — N926 Irregular menstruation, unspecified: Secondary | ICD-10-CM | POA: Diagnosis not present

## 2018-09-29 DIAGNOSIS — N926 Irregular menstruation, unspecified: Secondary | ICD-10-CM | POA: Diagnosis not present

## 2019-02-28 IMAGING — RF DG HIP (WITH PELVIS) OPERATIVE*R*
1 series · 2 of 2 positions shown · non-contrast
Comparison: None.

CLINICAL DATA: Right total hip arthroplasty.

EXAM:
OPERATIVE RIGHT HIP (WITH PELVIS IF PERFORMED) 1 VIEW
TECHNIQUE: Fluoroscopic spot image(s) were submitted for interpretation
post-operatively.

[Series 1: run · 2 of 2 slices shown]
[im 1/2]
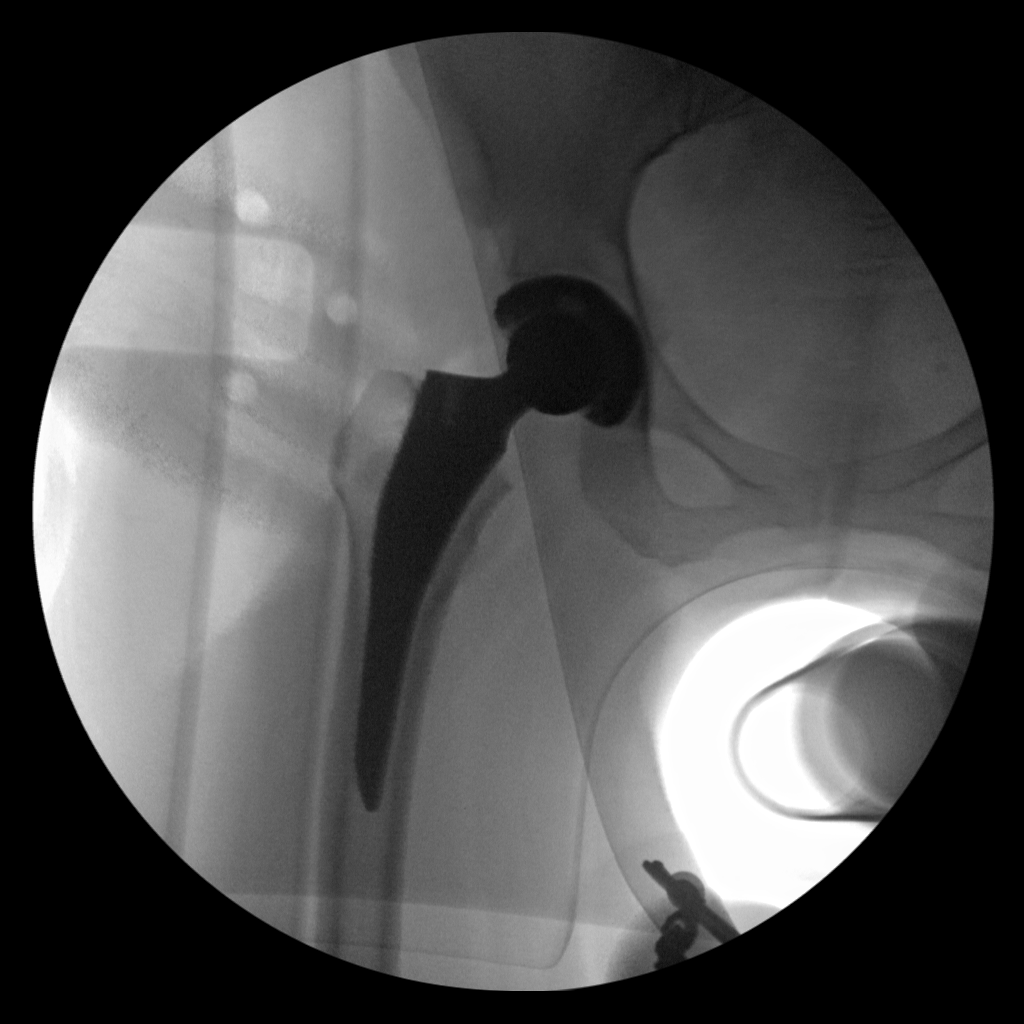
[im 2/2]
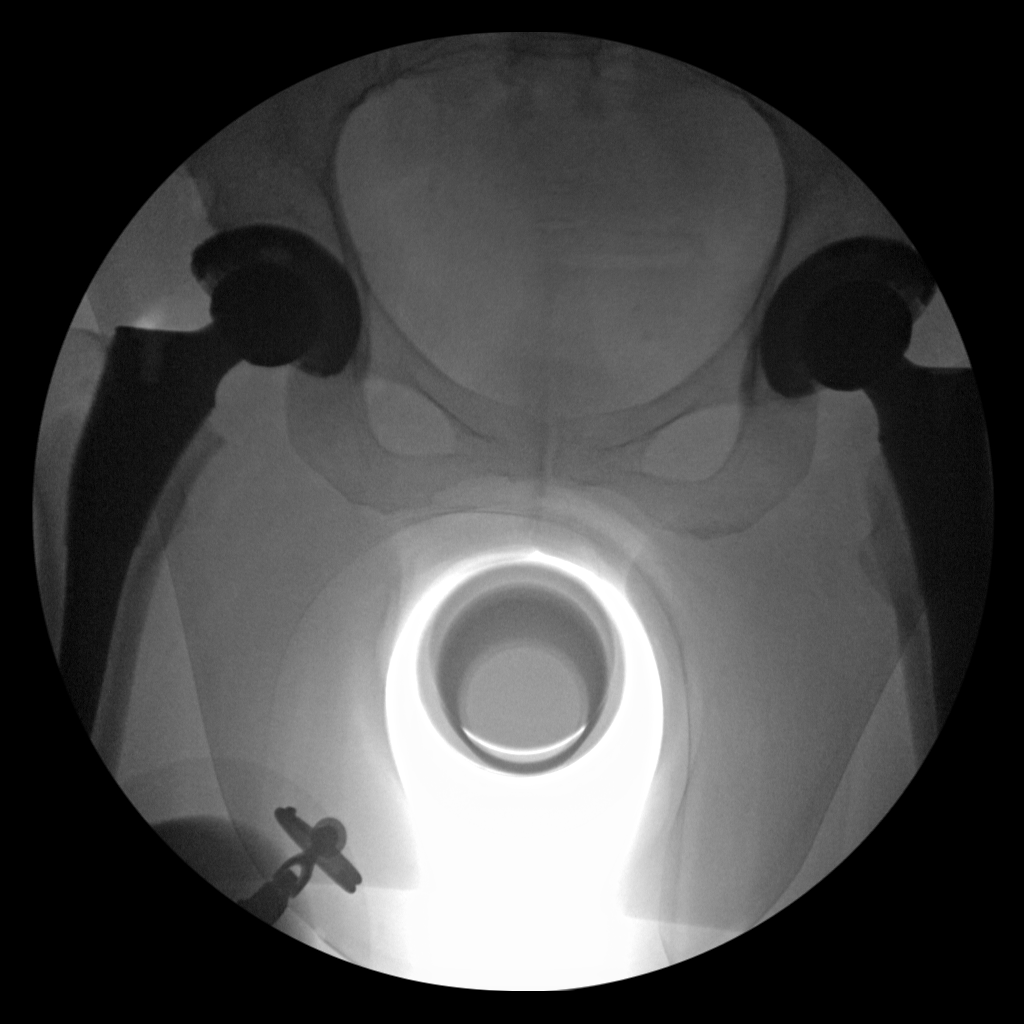

[2 of 2 positions shown; findings below may reference images not displayed]

FINDINGS: Anatomic alignment of the right hip prosthesis in the AP projection.
No visible complicating features.

AP pelvis image shows anatomic alignment of the contralateral left
hip prosthesis.
IMPRESSION: Anatomic alignment of the right hip prosthesis in the AP projection.

## 2019-10-17 DIAGNOSIS — Z96643 Presence of artificial hip joint, bilateral: Secondary | ICD-10-CM | POA: Diagnosis not present

## 2019-10-17 DIAGNOSIS — Z471 Aftercare following joint replacement surgery: Secondary | ICD-10-CM | POA: Diagnosis not present

## 2019-10-17 DIAGNOSIS — Z96642 Presence of left artificial hip joint: Secondary | ICD-10-CM | POA: Diagnosis not present

## 2019-10-17 DIAGNOSIS — Z96641 Presence of right artificial hip joint: Secondary | ICD-10-CM | POA: Diagnosis not present

## 2019-12-19 DIAGNOSIS — J329 Chronic sinusitis, unspecified: Secondary | ICD-10-CM | POA: Diagnosis not present

## 2019-12-28 DIAGNOSIS — Z20828 Contact with and (suspected) exposure to other viral communicable diseases: Secondary | ICD-10-CM | POA: Diagnosis not present

## 2020-07-16 DIAGNOSIS — J302 Other seasonal allergic rhinitis: Secondary | ICD-10-CM | POA: Diagnosis not present

## 2020-07-16 DIAGNOSIS — H1010 Acute atopic conjunctivitis, unspecified eye: Secondary | ICD-10-CM | POA: Diagnosis not present

## 2020-07-16 DIAGNOSIS — J4520 Mild intermittent asthma, uncomplicated: Secondary | ICD-10-CM | POA: Diagnosis not present

## 2020-07-23 DIAGNOSIS — Z20828 Contact with and (suspected) exposure to other viral communicable diseases: Secondary | ICD-10-CM | POA: Diagnosis not present

## 2021-01-03 DIAGNOSIS — Z0000 Encounter for general adult medical examination without abnormal findings: Secondary | ICD-10-CM | POA: Diagnosis not present

## 2021-01-03 DIAGNOSIS — L659 Nonscarring hair loss, unspecified: Secondary | ICD-10-CM | POA: Diagnosis not present

## 2021-01-03 DIAGNOSIS — Z13220 Encounter for screening for lipoid disorders: Secondary | ICD-10-CM | POA: Diagnosis not present

## 2021-01-03 DIAGNOSIS — Z1331 Encounter for screening for depression: Secondary | ICD-10-CM | POA: Diagnosis not present

## 2021-01-03 DIAGNOSIS — Z6828 Body mass index (BMI) 28.0-28.9, adult: Secondary | ICD-10-CM | POA: Diagnosis not present

## 2021-01-04 DIAGNOSIS — L659 Nonscarring hair loss, unspecified: Secondary | ICD-10-CM | POA: Diagnosis not present

## 2022-06-02 DIAGNOSIS — Z1231 Encounter for screening mammogram for malignant neoplasm of breast: Secondary | ICD-10-CM | POA: Diagnosis not present

## 2022-06-11 DIAGNOSIS — R928 Other abnormal and inconclusive findings on diagnostic imaging of breast: Secondary | ICD-10-CM | POA: Diagnosis not present

## 2022-06-11 DIAGNOSIS — N6489 Other specified disorders of breast: Secondary | ICD-10-CM | POA: Diagnosis not present

## 2022-06-11 DIAGNOSIS — R922 Inconclusive mammogram: Secondary | ICD-10-CM | POA: Diagnosis not present

## 2022-07-07 DIAGNOSIS — J302 Other seasonal allergic rhinitis: Secondary | ICD-10-CM | POA: Diagnosis not present

## 2023-01-22 DIAGNOSIS — J302 Other seasonal allergic rhinitis: Secondary | ICD-10-CM | POA: Diagnosis not present

## 2023-02-25 DIAGNOSIS — Z23 Encounter for immunization: Secondary | ICD-10-CM | POA: Diagnosis not present

## 2023-04-06 DIAGNOSIS — R928 Other abnormal and inconclusive findings on diagnostic imaging of breast: Secondary | ICD-10-CM | POA: Diagnosis not present

## 2023-05-11 DIAGNOSIS — J00 Acute nasopharyngitis [common cold]: Secondary | ICD-10-CM | POA: Diagnosis not present

## 2023-08-03 DIAGNOSIS — J4520 Mild intermittent asthma, uncomplicated: Secondary | ICD-10-CM | POA: Diagnosis not present

## 2023-08-03 DIAGNOSIS — Z6828 Body mass index (BMI) 28.0-28.9, adult: Secondary | ICD-10-CM | POA: Diagnosis not present

## 2024-04-12 DIAGNOSIS — Z6830 Body mass index (BMI) 30.0-30.9, adult: Secondary | ICD-10-CM | POA: Diagnosis not present

## 2024-04-12 DIAGNOSIS — J40 Bronchitis, not specified as acute or chronic: Secondary | ICD-10-CM | POA: Diagnosis not present

## 2024-04-12 DIAGNOSIS — J329 Chronic sinusitis, unspecified: Secondary | ICD-10-CM | POA: Diagnosis not present
